# Patient Record
Sex: Male | Born: 1960 | Race: White | Hispanic: No | Marital: Married | Smoking: Never smoker
Health system: Southern US, Community
[De-identification: ages and names within clinical notes are randomized; demographics above are authoritative.]

## PROBLEM LIST (undated history)

## (undated) DIAGNOSIS — I10 Essential (primary) hypertension: Secondary | ICD-10-CM

## (undated) DIAGNOSIS — J849 Interstitial pulmonary disease, unspecified: Secondary | ICD-10-CM

## (undated) DIAGNOSIS — K219 Gastro-esophageal reflux disease without esophagitis: Secondary | ICD-10-CM

## (undated) DIAGNOSIS — E785 Hyperlipidemia, unspecified: Secondary | ICD-10-CM

## (undated) HISTORY — DX: Interstitial pulmonary disease, unspecified: J84.9

## (undated) HISTORY — DX: Gastro-esophageal reflux disease without esophagitis: K21.9

## (undated) HISTORY — DX: Essential (primary) hypertension: I10

## (undated) HISTORY — PX: WISDOM TOOTH EXTRACTION: SHX21

## (undated) HISTORY — DX: Hyperlipidemia, unspecified: E78.5

---

## 2013-06-20 DIAGNOSIS — J849 Interstitial pulmonary disease, unspecified: Secondary | ICD-10-CM

## 2013-06-20 HISTORY — DX: Interstitial pulmonary disease, unspecified: J84.9

## 2013-09-17 ENCOUNTER — Encounter: Payer: Self-pay | Admitting: Family Medicine

## 2013-09-17 ENCOUNTER — Ambulatory Visit (INDEPENDENT_AMBULATORY_CARE_PROVIDER_SITE_OTHER): Payer: BC Managed Care – PPO | Admitting: Family Medicine

## 2013-09-17 VITALS — BP 180/99 | HR 52 | Temp 98.1°F | Resp 18 | Ht 70.5 in | Wt 249.0 lb

## 2013-09-17 DIAGNOSIS — E785 Hyperlipidemia, unspecified: Secondary | ICD-10-CM

## 2013-09-17 DIAGNOSIS — Z800 Family history of malignant neoplasm of digestive organs: Secondary | ICD-10-CM

## 2013-09-17 DIAGNOSIS — I10 Essential (primary) hypertension: Secondary | ICD-10-CM | POA: Insufficient documentation

## 2013-09-17 LAB — CBC WITH DIFFERENTIAL/PLATELET
Basophils Absolute: 0 10*3/uL (ref 0.0–0.1)
Basophils Relative: 0.5 % (ref 0.0–3.0)
EOS ABS: 0.2 10*3/uL (ref 0.0–0.7)
Eosinophils Relative: 3.4 % (ref 0.0–5.0)
HCT: 45.5 % (ref 39.0–52.0)
Hemoglobin: 15.5 g/dL (ref 13.0–17.0)
LYMPHS PCT: 27.1 % (ref 12.0–46.0)
Lymphs Abs: 1.8 10*3/uL (ref 0.7–4.0)
MCHC: 34 g/dL (ref 30.0–36.0)
MCV: 84.3 fl (ref 78.0–100.0)
MONOS PCT: 7.3 % (ref 3.0–12.0)
Monocytes Absolute: 0.5 10*3/uL (ref 0.1–1.0)
Neutro Abs: 4.1 10*3/uL (ref 1.4–7.7)
Neutrophils Relative %: 61.7 % (ref 43.0–77.0)
Platelets: 188 10*3/uL (ref 150.0–400.0)
RBC: 5.4 Mil/uL (ref 4.22–5.81)
RDW: 14.2 % (ref 11.5–14.6)
WBC: 6.6 10*3/uL (ref 4.5–10.5)

## 2013-09-17 LAB — COMPREHENSIVE METABOLIC PANEL
ALT: 42 U/L (ref 0–53)
AST: 24 U/L (ref 0–37)
Albumin: 4.4 g/dL (ref 3.5–5.2)
Alkaline Phosphatase: 47 U/L (ref 39–117)
BILIRUBIN TOTAL: 1.3 mg/dL — AB (ref 0.3–1.2)
BUN: 13 mg/dL (ref 6–23)
CALCIUM: 9.1 mg/dL (ref 8.4–10.5)
CHLORIDE: 100 meq/L (ref 96–112)
CO2: 34 meq/L — AB (ref 19–32)
Creatinine, Ser: 0.9 mg/dL (ref 0.4–1.5)
GFR: 91.67 mL/min (ref >60.00)
Glucose, Bld: 112 mg/dL — ABNORMAL HIGH (ref 70–99)
Potassium: 4.4 mEq/L (ref 3.5–5.1)
SODIUM: 140 meq/L (ref 135–145)
TOTAL PROTEIN: 7.7 g/dL (ref 6.0–8.3)

## 2013-09-17 LAB — LIPID PANEL
Cholesterol: 161 mg/dL (ref 0–200)
HDL: 32.8 mg/dL — AB (ref >39.00)
LDL Cholesterol: 68 mg/dL (ref 0–99)
Total CHOL/HDL Ratio: 5
Triglycerides: 299 mg/dL — ABNORMAL HIGH (ref 0.0–149.0)
VLDL: 59.8 mg/dL — ABNORMAL HIGH (ref 0.0–40.0)

## 2013-09-17 LAB — TSH: TSH: 0.35 u[IU]/mL (ref 0.35–5.50)

## 2013-09-17 MED ORDER — LISINOPRIL-HYDROCHLOROTHIAZIDE 10-12.5 MG PO TABS: 1.0000 | ORAL_TABLET | Freq: Every day | ORAL | Status: DC

## 2013-09-17 MED ORDER — LOVASTATIN 20 MG PO TABS: 20.0000 mg | ORAL_TABLET | Freq: Every day | ORAL | Status: DC

## 2013-09-17 NOTE — Assessment & Plan Note (Signed)
Historically good control per pt's report of home monitoring. BP up today due to being off meds x 2d, new MD office setting, worried about getting back to work. Renewed lisin/hctz 10/12.5 qd and he'll continue home monitoring to document that bp returns to previously well controlled status. Once bp back to normal, I encouraged him to start 81mg  ASA qd for primary prevention of CV dz. CMET today.

## 2013-09-17 NOTE — Progress Notes (Signed)
Office Note 09/17/2013  CC:  Chief Complaint  Patient presents with  . Establish Care    HPI:  Fred [name] is a 53 y.o. White male who is here to establish care. Patient's most recent primary MD: [facility] (3-4 times).  Switched to me b/c he saw a male provider there last visit and says he prefers to talk to a male. Old records were not reviewed prior to or during today's visit.  Ran out of bp med 2 d/a.   Home monitoring of bp with wrist cuff shows 140s/80s.  Currently on lovastatin 20mg  qd and has been on this since dx of hyperlipidemia. Estimated last labs done approx 1 yr ago.  He is fasting today.  Past Medical History  Diagnosis Date  . GERD (gastroesophageal reflux disease)     controlled well with diet and prn H2 blocker  . Hyperlipidemia approx 2008  . Hypertension approx 2008    Past Surgical History  Procedure Laterality Date  . Wisdom tooth extraction  approx 2008    No bleeding or anesthesia issues.    Family History  Problem Relation Age of Onset  . Cancer Mother     colon and breast  . Arthritis Mother   . Diabetes Mother   . Heart disease Mother   . Hyperlipidemia Mother   . Kidney disease Mother   . Heart disease Father     "died in his sleep", family suspected heart dz    History   Social History  . Marital Status: Married    Spouse Name: N/A    Number of Children: N/A  . Years of Education: N/A   Occupational History  . Not on file.   Social History Main Topics  . Smoking status: Never Smoker   . Smokeless tobacco: Never Used  . Alcohol Use: Yes  . Drug Use: Not on file  . Sexual Activity: Not on file   Other Topics Concern  . Not on file   Social History Narrative   Married, no children.   [place]   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   College: [employer]   Never smoker, alcohol occasional/social,     Outpatient Encounter Prescriptions as of 09/17/2013  Medication  Sig  . lisinopril-hydrochlorothiazide (PRINZIDE,ZESTORETIC) 10-12.5 MG per tablet Take 1 tablet by mouth daily.  Marland Kitchen. lovastatin (MEVACOR) 20 MG tablet Take 1 tablet (20 mg total) by mouth at bedtime.  . [DISCONTINUED] lisinopril-hydrochlorothiazide (PRINZIDE,ZESTORETIC) 10-12.5 MG per tablet Take 1 tablet by mouth daily.  . [DISCONTINUED] lovastatin (MEVACOR) 20 MG tablet Take 20 mg by mouth at bedtime.    No Known Allergies  ROS Review of Systems  Constitutional: Negative for fever and fatigue.  HENT: Negative for congestion and sore throat.   Eyes: Negative for visual disturbance.  Respiratory: Negative for cough.   Cardiovascular: Negative for chest pain.  Gastrointestinal: Negative for nausea and abdominal pain.  Genitourinary: Negative for dysuria.  Musculoskeletal: Negative for back pain and joint swelling.  Skin: Negative for rash.  Neurological: Negative for weakness and headaches.  Hematological: Negative for adenopathy.    PE; Blood pressure 180/99, pulse 52, temperature 98.1 F (36.7 C), temperature source Oral, resp. rate 18, height 5' 10.5" (1.791 m), weight 249 lb (112.946 kg), SpO2 96.00%. Gen: Alert, well appearing.  Patient is oriented to person, place, time, and situation. ZOX:WRUEENT:Eyes: no injection, icteris, swelling, or exudate.  EOMI, PERRLA. Mouth: lips without lesion/swelling.  Oral mucosa  pink and moist. Oropharynx without erythema, exudate, or swelling.  Neck - No masses or thyromegaly or limitation in range of motion CV: RRR (HR low 60s), no m/r/g.   LUNGS: CTA bilat, nonlabored resps, good aeration in all lung fields. EXT: no clubbing, cyanosis, or edema.   Pertinent labs:  None today  ASSESSMENT AND PLAN:   New pt; obtain old records.  Hyperlipidemia Historically well controlled on lovastatin so will renew this rx and recheck FLP today. AST/ALT today.  HTN (hypertension), benign Historically good control per pt's report of home monitoring. BP up  today due to being off meds x 2d, new MD office setting, worried about getting back to work. Renewed lisin/hctz 10/12.5 qd and he'll continue home monitoring to document that bp returns to previously well controlled status. Once bp back to normal, I encouraged him to start 81mg  ASA qd for primary prevention of CV dz. CMET today.  Family history of colon cancer Made pt aware of the importance of getting screening colonoscopy. He did not want referral for this today. Will bring this up again at CPE in 6 mo, at which time we'll also do DRE/PSA screening.   An After Visit Summary was printed and given to the patient.  Return in about 6 months (around 03/19/2014) for annual CPE (fasting).

## 2013-09-17 NOTE — Assessment & Plan Note (Signed)
Historically well controlled on lovastatin so will renew this rx and recheck FLP today. AST/ALT today.

## 2013-09-17 NOTE — Assessment & Plan Note (Signed)
Made pt aware of the importance of getting screening colonoscopy. He did not want referral for this today. Will bring this up again at CPE in 6 mo, at which time we'll also do DRE/PSA screening.

## 2013-09-18 ENCOUNTER — Ambulatory Visit: Payer: BC Managed Care – PPO

## 2013-09-18 ENCOUNTER — Telehealth: Payer: Self-pay | Admitting: Family Medicine

## 2013-09-18 DIAGNOSIS — R7301 Impaired fasting glucose: Secondary | ICD-10-CM

## 2013-09-18 LAB — HEMOGLOBIN A1C: HEMOGLOBIN A1C: 5.6 % (ref 4.6–6.5)

## 2013-09-18 NOTE — Telephone Encounter (Signed)
Relevant patient education assigned to patient using Emmi. ° °

## 2013-09-19 ENCOUNTER — Other Ambulatory Visit: Payer: Self-pay | Admitting: Family Medicine

## 2013-09-19 DIAGNOSIS — E782 Mixed hyperlipidemia: Secondary | ICD-10-CM

## 2013-09-19 MED ORDER — FENOFIBRATE 160 MG PO TABS: 160.0000 mg | ORAL_TABLET | Freq: Every day | ORAL | Status: DC

## 2014-01-13 ENCOUNTER — Other Ambulatory Visit: Payer: Self-pay | Admitting: Family Medicine

## 2014-01-13 MED ORDER — FENOFIBRATE 160 MG PO TABS: 160.0000 mg | ORAL_TABLET | Freq: Every day | ORAL | Status: DC

## 2014-04-14 ENCOUNTER — Encounter: Payer: Self-pay | Admitting: Family Medicine

## 2014-04-14 ENCOUNTER — Ambulatory Visit (INDEPENDENT_AMBULATORY_CARE_PROVIDER_SITE_OTHER): Payer: BC Managed Care – PPO | Admitting: Family Medicine

## 2014-04-14 VITALS — BP 182/109 | HR 72 | Temp 97.8°F | Ht 70.5 in | Wt 245.0 lb

## 2014-04-14 DIAGNOSIS — I10 Essential (primary) hypertension: Secondary | ICD-10-CM

## 2014-04-14 DIAGNOSIS — J189 Pneumonia, unspecified organism: Secondary | ICD-10-CM

## 2014-04-14 MED ORDER — CLARITHROMYCIN 500 MG PO TABS: 500.0000 mg | ORAL_TABLET | Freq: Two times a day (BID) | ORAL | Status: DC

## 2014-04-14 MED ORDER — FENOFIBRATE 160 MG PO TABS: 160.0000 mg | ORAL_TABLET | Freq: Every day | ORAL | Status: DC

## 2014-04-14 NOTE — Progress Notes (Signed)
Pre visit review using our clinic review tool, if applicable. No additional management support is needed unless otherwise documented below in the visit note. 

## 2014-04-14 NOTE — Patient Instructions (Signed)
Don't take your lovastatin and your fenofibrate while you are taking your antibiotic (clarithromycin).  Take mucinex DM extra strength OTC as directed on packaging--for cough.

## 2014-04-14 NOTE — Progress Notes (Signed)
OFFICE NOTE  04/14/2014  CC:  Chief Complaint  Patient presents with  . Cough  . Fever   HPI: Patient is a 53 y.o. Caucasian male who is here for cough. At first he would feel post-activity cough/chest phlegm, and now in the last 1 week he has had much worse cough--constant.  Nyquil helps for short period.  Tm 100.1 as recently as several days ago.  A bit of URI sx's but primarily chest.  Question of wheezing/chest tightness.  He does not smoke.   Pertinent PMH:  Past medical, surgical, social, and family history reviewed and no changes are noted since last office visit.  MEDS:  Outpatient Prescriptions Prior to Visit  Medication Sig Dispense Refill  . fenofibrate 160 MG tablet Take 1 tablet (160 mg total) by mouth daily.  30 tablet  3  . lisinopril-hydrochlorothiazide (PRINZIDE,ZESTORETIC) 10-12.5 MG per tablet Take 1 tablet by mouth daily.  90 tablet  4  . lovastatin (MEVACOR) 20 MG tablet Take 1 tablet (20 mg total) by mouth at bedtime.  90 tablet  4   No facility-administered medications prior to visit.    PE: Blood pressure 182/109, pulse 72, temperature 97.8 F (36.6 C), temperature source Temporal, height 5' 10.5" (1.791 m), weight 245 lb (111.131 kg), SpO2 93.00%. VS: noted--normal. Gen: alert, NAD, NONTOXIC APPEARING. HEENT: eyes without injection, drainage, or swelling.  Ears: EACs clear, TMs with normal light reflex and landmarks.  Nose: Clear rhinorrhea, with some dried, crusty exudate adherent to mildly injected mucosa.  No purulent d/c.  No paranasal sinus TTP.  No facial swelling.  Throat and mouth without focal lesion.  No pharyngial swelling, erythema, or exudate.   Neck: supple, no LAD.   LUNGS: Subtle right basilar insp crackles and diminished BS, mild increase in tactile fremitus in this region compared to left base, nonlabored resps.  No wheezing, no prolongation of exp phase, no excessive post-exhalation coughing. CV: RRR, no m/r/g. EXT: no c/c/e SKIN: no  rash   IMPRESSION AND PLAN:  Clinical pneumonia. Clarithromycin 500 mg bid x 10d. Hold fenofibrate and lovastatin while taking this med. Monitor bp and HR at home daily and return to go over these and recheck current illness in 1 week.  An After Visit Summary was printed and given to the patient.  FOLLOW UP: 1 wk

## 2014-04-22 ENCOUNTER — Ambulatory Visit (INDEPENDENT_AMBULATORY_CARE_PROVIDER_SITE_OTHER): Payer: BC Managed Care – PPO | Admitting: Family Medicine

## 2014-04-22 ENCOUNTER — Encounter: Payer: Self-pay | Admitting: Family Medicine

## 2014-04-22 VITALS — BP 179/107 | HR 64 | Temp 98.6°F | Resp 18 | Ht 70.5 in | Wt 244.0 lb

## 2014-04-22 DIAGNOSIS — R0602 Shortness of breath: Secondary | ICD-10-CM

## 2014-04-22 DIAGNOSIS — J189 Pneumonia, unspecified organism: Secondary | ICD-10-CM

## 2014-04-22 MED ORDER — LISINOPRIL-HYDROCHLOROTHIAZIDE 20-25 MG PO TABS: 1.0000 | ORAL_TABLET | Freq: Every day | ORAL | Status: DC

## 2014-04-22 NOTE — Progress Notes (Signed)
Pre visit review using our clinic review tool, if applicable. No additional management support is needed unless otherwise documented below in the visit note. 

## 2014-04-22 NOTE — Progress Notes (Signed)
OFFICE NOTE  04/22/2014  CC:  Chief Complaint  Patient presents with  . Follow-up     HPI: Patient is a 53 y.o. Caucasian male who is here for 1 wk f/u pneumonia and poorly controlled HTN. Feeling better-Less cough but still SOB-esp with activity (he says this has been going on for the last year, assoc with wt gain)..  Eating fine.  No fevers.  Tolerating clarith fine.  Home BP's: 140 avg syst, avg diast 80s, HR 60s.  "My BP's always up when they check it at work".   Stressful job, might retire in 6 mo.  Pertinent PMH:  Past medical, surgical, social, and family history reviewed and no changes are noted since last office visit.  MEDS:  Outpatient Prescriptions Prior to Visit  Medication Sig Dispense Refill  . clarithromycin (BIAXIN) 500 MG tablet Take 1 tablet (500 mg total) by mouth 2 (two) times daily. 20 tablet 0  . fenofibrate 160 MG tablet Take 1 tablet (160 mg total) by mouth daily. 30 tablet 6  . lisinopril-hydrochlorothiazide (PRINZIDE,ZESTORETIC) 10-12.5 MG per tablet Take 1 tablet by mouth daily. 90 tablet 4  . lovastatin (MEVACOR) 20 MG tablet Take 1 tablet (20 mg total) by mouth at bedtime. 90 tablet 4   No facility-administered medications prior to visit.    PE: Blood pressure 179/107, pulse 64, temperature 98.6 F (37 C), temperature source Temporal, resp. rate 18, height 5' 10.5" (1.791 m), weight 244 lb (110.678 kg), SpO2 96 %. Gen: Alert, well appearing.  Patient is oriented to person, place, time, and situation. CV: RRR, no m/r/g LUNGS: Still with signif diminished BS on right--about the bottom 1/2 of post lung field, with some insp crackles mid way up but not at base.  Left side normal.  Nonlabored resps.  IMPRESSION AND PLAN:  1) Pneumonia, moderate clinical improvement.  With SOB prior to current illness + subtle worsening of his right posterior chest breath sounds I want to check a CXR today.  Finish clarith.  2) HTN, poor control.  Increase lisin-hctz  to 20/25, 1 qd. Continue home bp monitoring.  An After Visit Summary was printed and given to the patient.  FOLLOW UP: 1 mo-check BMET at that time.

## 2014-04-23 ENCOUNTER — Ambulatory Visit (HOSPITAL_BASED_OUTPATIENT_CLINIC_OR_DEPARTMENT_OTHER)
Admission: RE | Admit: 2014-04-23 | Discharge: 2014-04-23 | Disposition: A | Discharge status: Home / Self Care | Payer: BC Managed Care – PPO | Source: Ambulatory Visit | Attending: Family Medicine | Admitting: Family Medicine

## 2014-04-23 DIAGNOSIS — J189 Pneumonia, unspecified organism: Secondary | ICD-10-CM

## 2014-04-23 DIAGNOSIS — R0602 Shortness of breath: Secondary | ICD-10-CM | POA: Diagnosis not present

## 2014-04-24 ENCOUNTER — Other Ambulatory Visit: Payer: Self-pay | Admitting: Family Medicine

## 2014-04-24 ENCOUNTER — Encounter: Payer: Self-pay | Admitting: Family Medicine

## 2014-04-24 DIAGNOSIS — J849 Interstitial pulmonary disease, unspecified: Secondary | ICD-10-CM

## 2014-05-20 ENCOUNTER — Ambulatory Visit: Payer: BC Managed Care – PPO | Admitting: Family Medicine

## 2014-05-20 ENCOUNTER — Ambulatory Visit (INDEPENDENT_AMBULATORY_CARE_PROVIDER_SITE_OTHER): Payer: BC Managed Care – PPO | Admitting: Emergency Medicine

## 2014-05-20 ENCOUNTER — Encounter: Payer: Self-pay | Admitting: Emergency Medicine

## 2014-05-20 VITALS — BP 142/92 | HR 68 | Ht 70.0 in | Wt 249.4 lb

## 2014-05-20 DIAGNOSIS — J849 Interstitial pulmonary disease, unspecified: Secondary | ICD-10-CM

## 2014-05-20 DIAGNOSIS — R053 Chronic cough: Secondary | ICD-10-CM

## 2014-05-20 DIAGNOSIS — R05 Cough: Secondary | ICD-10-CM

## 2014-05-20 MED ORDER — LOSARTAN POTASSIUM-HCTZ 100-25 MG PO TABS: 1.0000 | ORAL_TABLET | Freq: Every day | ORAL | Status: DC

## 2014-05-20 NOTE — Assessment & Plan Note (Addendum)
Etiology unclear. Suspect he does have a component of GERD affecting his upper airway. Certainly he may have lower airways disease and needs pulmonary function testing. Do not know whether his chronic cough and the changes on chest x-ray are related to each other. We will change his lisinopril to losartan

## 2014-05-20 NOTE — Assessment & Plan Note (Signed)
Based on chest x-ray performed 11/4 after he was treated for an acute pneumonia versus bronchitis. Not clear to me whether the changes described are related to the infection or otherwise. Would like to perform a high-resolution CT scan of his chest now that a month has passed since he was treated.

## 2014-05-20 NOTE — Patient Instructions (Addendum)
We will perform a CT scan of your chest We will perform full pulmonary function testing We will change your lisinopril/HCTZ to losartan/HCTZ 100/25mg  once a day Follow with Dr Delton CoombesByrum next available to discuss your test results.

## 2014-05-20 NOTE — Addendum Note (Signed)
Addended by: Christen ButterASKIN, Leanord Thibeau M on: 05/20/2014 03:40 PM   Modules accepted: Orders

## 2014-05-20 NOTE — Progress Notes (Signed)
Subjective:    Patient ID: Fred [name], male    DOB: 04-13-1961, 53 y.o.   MRN: [id number]  HPI 53 yo man, never smoker, with hx HTN, GERD.  Referred by Dr Milinda CaveMcGowen, He has had a cough for a year. Then the cough became productive after he was exposed to a URI at his office, and was accompanied by fever, fatigue, malaise. He was dx with PNA and treated with clarithromycin 10/26. He improved but kept some SOB a annoying dry cough. A CXR was done 11/4, possible atypical infxn vs ILD?   No significant occupational exposures.  No Eli Lilly and Companymilitary.  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  He has cats, used to own a cockatiel but died 10 yrs ago No water damage or mold to his knowledge   He has occasional GERD sx No real allergy sx.  Lisinopril x 4 yrs ago.    Review of Systems  Constitutional: Negative for fever and unexpected weight change.  HENT: Negative for congestion, dental problem, ear pain, nosebleeds, postnasal drip, rhinorrhea, sinus pressure, sneezing, sore throat and trouble swallowing.   Eyes: Negative for redness and itching.  Respiratory: Positive for cough, shortness of breath and wheezing. Negative for chest tightness.   Cardiovascular: Negative for palpitations and leg swelling.  Gastrointestinal: Negative for nausea and vomiting.  Genitourinary: Negative for dysuria.  Musculoskeletal: Negative for joint swelling.  Skin: Negative for rash.  Neurological: Negative for headaches.  Hematological: Does not bruise/bleed easily.  Psychiatric/Behavioral: Negative for dysphoric mood. The patient is not nervous/anxious.     Past Medical History  Diagnosis Date  . GERD (gastroesophageal reflux disease)     controlled well with diet and prn H2 blocker  . Hyperlipidemia approx 2008  . Hypertension approx 2008  . Interstitial lung disease 2015    Per CXR 04/2014--referred to pulm.     Family History  Problem Relation Age of Onset  . Cancer Mother    colon and breast  . Arthritis Mother   . Diabetes Mother   . Heart disease Mother   . Hyperlipidemia Mother   . Kidney disease Mother   . Heart disease Father     "died in his sleep", family suspected heart dz     History   Social History  . Marital Status: Married    Spouse Name: N/A    Number of Children: N/A  . Years of Education: N/A   Occupational History  . Data processing managerBranch Manager    Social History Main Topics  . Smoking status: Never Smoker   . Smokeless tobacco: Never Used  . Alcohol Use: 0.0 oz/week    0 Not specified per week  . Drug Use: No  . Sexual Activity: Not on file   Other Topics Concern  . Not on file   Social History Narrative   Married, no children.   [place] resident [place].   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   College: [employer]   Never smoker, alcohol occasional/social,      No Known Allergies   Outpatient Prescriptions Prior to Visit  Medication Sig Dispense Refill  . fenofibrate 160 MG tablet Take 1 tablet (160 mg total) by mouth daily. 30 tablet 6  . lisinopril-hydrochlorothiazide (PRINZIDE,ZESTORETIC) 20-25 MG per tablet Take 1 tablet by mouth daily. 90 tablet 3  . lovastatin (MEVACOR) 20 MG tablet Take 1 tablet (20 mg total) by mouth at bedtime. 90 tablet 4  .  clarithromycin (BIAXIN) 500 MG tablet Take 1 tablet (500 mg total) by mouth 2 (two) times daily. 20 tablet 0   No facility-administered medications prior to visit.         Objective:   Physical Exam Filed Vitals:   05/20/14 1433  BP: 142/92  Pulse: 68  Height: 5\' 10"  (1.778 m)  Weight: 249 lb 6.4 oz (113.127 kg)  SpO2: 97%   Gen: Pleasant, well-nourished, in no distress,  normal affect  ENT: No lesions,  mouth clear,  oropharynx clear, no postnasal drip  Neck: No JVD, no TMG, no carotid bruits  Lungs: No use of accessory muscles, clear without rales or rhonchi  Cardiovascular: RRR, heart sounds normal, no murmur or gallops, no peripheral  edema  Musculoskeletal: No deformities, no cyanosis or clubbing  Neuro: alert, non focal  Skin: Warm, no lesions or rashes      Assessment & Plan:  ILD (interstitial lung disease) Based on chest x-ray performed 11/4 after he was treated for an acute pneumonia versus bronchitis. Not clear to me whether the changes described are related to the infection or otherwise. Would like to perform a high-resolution CT scan of his chest now that a month has passed since he was treated.   Chronic cough Etiology unclear. Suspect he does have a component of GERD affecting his upper airway. Certainly he may have lower airways disease and needs pulmonary function testing. Do not know whether his chronic cough and the changes on chest x-ray are related to each other. We will change his lisinopril to losartan

## 2014-05-28 ENCOUNTER — Ambulatory Visit (INDEPENDENT_AMBULATORY_CARE_PROVIDER_SITE_OTHER)
Admission: RE | Admit: 2014-05-28 | Discharge: 2014-05-28 | Disposition: A | Discharge status: Home / Self Care | Payer: BC Managed Care – PPO | Source: Ambulatory Visit | Attending: Emergency Medicine | Admitting: Emergency Medicine

## 2014-05-28 DIAGNOSIS — J849 Interstitial pulmonary disease, unspecified: Secondary | ICD-10-CM

## 2014-06-04 ENCOUNTER — Ambulatory Visit (INDEPENDENT_AMBULATORY_CARE_PROVIDER_SITE_OTHER): Payer: BC Managed Care – PPO | Admitting: Emergency Medicine

## 2014-06-04 DIAGNOSIS — J849 Interstitial pulmonary disease, unspecified: Secondary | ICD-10-CM

## 2014-06-04 LAB — PULMONARY FUNCTION TEST
DL/VA % PRED: 145 %
DL/VA: 6.8 ml/min/mmHg/L
DLCO UNC: 20.92 ml/min/mmHg
DLCO unc % pred: 63 %
FEF 25-75 POST: 2.86 L/s
FEF 25-75 Pre: 1.85 L/sec
FEF2575-%CHANGE-POST: 54 %
FEF2575-%PRED-PRE: 54 %
FEF2575-%Pred-Post: 85 %
FEV1-%Change-Post: 7 %
FEV1-%PRED-PRE: 42 %
FEV1-%Pred-Post: 45 %
FEV1-POST: 1.78 L
FEV1-PRE: 1.65 L
FEV1FVC-%CHANGE-POST: 8 %
FEV1FVC-%Pred-Pre: 107 %
FEV6-%CHANGE-POST: 0 %
FEV6-%PRED-POST: 40 %
FEV6-%Pred-Pre: 41 %
FEV6-PRE: 2 L
FEV6-Post: 1.98 L
FEV6FVC-%Change-Post: 0 %
FEV6FVC-%Pred-Post: 103 %
FEV6FVC-%Pred-Pre: 103 %
FVC-%CHANGE-POST: 0 %
FVC-%Pred-Post: 39 %
FVC-%Pred-Pre: 39 %
FVC-POST: 2 L
FVC-Pre: 2.01 L
POST FEV1/FVC RATIO: 89 %
PRE FEV1/FVC RATIO: 82 %
Post FEV6/FVC ratio: 100 %
Pre FEV6/FVC Ratio: 100 %
RV % pred: 46 %
RV: 0.99 L
TLC % pred: 42 %
TLC: 2.98 L

## 2014-06-04 NOTE — Progress Notes (Signed)
PFT done today. 

## 2014-06-18 ENCOUNTER — Encounter: Payer: Self-pay | Admitting: Emergency Medicine

## 2014-06-18 ENCOUNTER — Ambulatory Visit (INDEPENDENT_AMBULATORY_CARE_PROVIDER_SITE_OTHER): Payer: BC Managed Care – PPO | Admitting: Emergency Medicine

## 2014-06-18 ENCOUNTER — Other Ambulatory Visit: Payer: BC Managed Care – PPO

## 2014-06-18 VITALS — BP 190/130 | HR 73 | Ht 71.0 in | Wt 251.4 lb

## 2014-06-18 DIAGNOSIS — J849 Interstitial pulmonary disease, unspecified: Secondary | ICD-10-CM

## 2014-06-18 LAB — RHEUMATOID FACTOR

## 2014-06-18 MED ORDER — HYDROCODONE-HOMATROPINE 5-1.5 MG/5ML PO SYRP: 5.0000 mL | ORAL_SOLUTION | Freq: Four times a day (QID) | ORAL | Status: DC | PRN

## 2014-06-18 NOTE — Patient Instructions (Signed)
We will perform blood work today We will follow up and decide whether to perform bronchoscopy or to refer you for lung biopsy Please stop losartan/HCTZ and restart you old BP medication - lisinopril/HCTZ Use hycodan as directed for your cough.  Follow with Dr Delton CoombesByrum after your blood work to discuss results.

## 2014-06-18 NOTE — Progress Notes (Signed)
Subjective:    Patient ID: Fred [name], male    DOB: 10-16-1960, 53 y.o.   MRN: [id number]  HPI 53 yo man, never smoker, with hx HTN, GERD.  Referred by Dr Milinda CaveMcGowen, He has had a cough for a year. Then the cough became productive after he was exposed to a URI at his office, and was accompanied by fever, fatigue, malaise. He was dx with PNA and treated with clarithromycin 10/26. He improved but kept some SOB a annoying dry cough. A CXR was done 11/4, possible atypical infxn vs ILD?   ROV 06/18/14 -- follow up visit for cough. Returns stating that his cough is not really improved -  He underwent PFT that showed principally restriction. 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 Etiology unclear. PFT support restriction.     No significant occupational exposures.  No Eli Lilly and Companymilitary.  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  He has cats, used to own a cockatiel but died 10 yrs ago No water damage or mold to his knowledge   He has occasional GERD sx No real allergy sx.  Lisinopril x 4 yrs ago.    Review of Systems  Constitutional: Negative for fever and unexpected weight change.  HENT: Negative for congestion, dental problem, ear pain, nosebleeds, postnasal drip, rhinorrhea, sinus pressure, sneezing, sore throat and trouble swallowing.   Eyes: Negative for redness and itching.  Respiratory: Positive for cough, shortness of breath and wheezing. Negative for chest tightness.   Cardiovascular: Negative for palpitations and leg swelling.  Gastrointestinal: Negative for nausea and vomiting.  Genitourinary: Negative for dysuria.  Musculoskeletal: Negative for joint swelling.  Skin: Negative for rash.  Neurological: Negative for headaches.  Hematological: Does not bruise/bleed easily.  Psychiatric/Behavioral: Negative for dysphoric mood. The patient is not nervous/anxious.     Past Medical History  Diagnosis Date  . GERD (gastroesophageal reflux  disease)     controlled well with diet and prn H2 blocker  . Hyperlipidemia approx 2008  . Hypertension approx 2008  . Interstitial lung disease 2015    Per CXR 04/2014--referred to pulm.     Family History  Problem Relation Age of Onset  . Cancer Mother     colon and breast  . Arthritis Mother   . Diabetes Mother   . Heart disease Mother   . Hyperlipidemia Mother   . Kidney disease Mother   . Heart disease Father     "died in his sleep", family suspected heart dz     History   Social History  . Marital Status: Married    Spouse Name: N/A    Number of Children: N/A  . Years of Education: N/A   Occupational History  . Data processing managerBranch Manager    Social History Main Topics  . Smoking status: Never Smoker   . Smokeless tobacco: Never Used  . Alcohol Use: 0.0 oz/week    0 Not specified per week  . Drug Use: No  . Sexual Activity: Not on file   Other Topics Concern  . Not on file   Social History Narrative   Married, no children.   Raised [place] resident since [place].   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   College: [employer]   Never smoker, alcohol occasional/social,      No Known Allergies   Outpatient Prescriptions Prior to Visit  Medication Sig Dispense Refill  . fenofibrate 160 MG tablet Take  1 tablet (160 mg total) by mouth daily. 30 tablet 6  . losartan-hydrochlorothiazide (HYZAAR) 100-25 MG per tablet Take 1 tablet by mouth daily. 30 tablet 2  . lovastatin (MEVACOR) 20 MG tablet Take 1 tablet (20 mg total) by mouth at bedtime. 90 tablet 4  . lisinopril-hydrochlorothiazide (PRINZIDE,ZESTORETIC) 20-25 MG per tablet Take 1 tablet by mouth daily. (Patient not taking: Reported on 06/18/2014) 90 tablet 3   No facility-administered medications prior to visit.         Objective:   Physical Exam Filed Vitals:   06/18/14 1341  BP: 190/130  Pulse: 73  Height: 5\' 11"  (1.803 m)  Weight: 251 lb 6.4 oz (114.034 kg)  SpO2: 95%   Gen: Pleasant,  well-nourished, in no distress,  normal affect  ENT: No lesions,  mouth clear,  oropharynx clear, no postnasal drip  Neck: No JVD, no TMG, no carotid bruits  Lungs: No use of accessory muscles, B insp crackles at bases.   Cardiovascular: RRR, heart sounds normal, no murmur or gallops, no peripheral edema  Musculoskeletal: No deformities, no cyanosis or clubbing  Neuro: alert, non focal  Skin: Warm, no lesions or rashes      Assessment & Plan:  ILD (interstitial lung disease) Confirmed on his CT chest. Etiology unclear, looks like a UIP pattern. Suspect that this is the cause of his cough and sx.  - auto-immune labs - will need to consider early VATS, will discuss with him next time.  - may want to refer to Dr MR to discuss trials, dx, perfinidone, etc.  - rov next available.

## 2014-06-18 NOTE — Assessment & Plan Note (Signed)
Confirmed on his CT chest. Etiology unclear, looks like a UIP pattern. Suspect that this is the cause of his cough and sx.  - auto-immune labs - will need to consider early VATS, will discuss with him next time.  - may want to refer to Dr MR to discuss trials, dx, perfinidone, etc.  - rov next available.

## 2014-06-19 LAB — ANCA SCREEN W REFLEX TITER
Atypical p-ANCA Screen: NEGATIVE
P-ANCA SCREEN: NEGATIVE
c-ANCA Screen: NEGATIVE

## 2014-06-19 LAB — SJOGRENS SYNDROME-A EXTRACTABLE NUCLEAR ANTIBODY: SSA (Ro) (ENA) Antibody, IgG: <1

## 2014-06-19 LAB — ANTI-SCLERODERMA ANTIBODY: Scleroderma (Scl-70) (ENA) Antibody, IgG: <1

## 2014-06-19 LAB — SJOGRENS SYNDROME-B EXTRACTABLE NUCLEAR ANTIBODY: SSB (La) (ENA) Antibody, IgG: <1

## 2014-06-19 LAB — ANTI-DNA ANTIBODY, DOUBLE-STRANDED

## 2014-06-19 LAB — ANA: ANA: NEGATIVE

## 2014-07-03 ENCOUNTER — Ambulatory Visit (INDEPENDENT_AMBULATORY_CARE_PROVIDER_SITE_OTHER): Payer: BLUE CROSS/BLUE SHIELD | Admitting: Emergency Medicine

## 2014-07-03 ENCOUNTER — Encounter: Payer: Self-pay | Admitting: Emergency Medicine

## 2014-07-03 VITALS — BP 164/92 | HR 81 | Temp 97.9°F | Ht 71.0 in | Wt 249.0 lb

## 2014-07-03 DIAGNOSIS — J849 Interstitial pulmonary disease, unspecified: Secondary | ICD-10-CM

## 2014-07-03 NOTE — Patient Instructions (Signed)
We will arrange for bronchoscopy on January 22 We will also arrange for you to follow-up with Dr. Marchelle Gearingamaswamy to further discuss your interstitial lung disease.

## 2014-07-03 NOTE — Assessment & Plan Note (Signed)
Interstitial lung disease of unclear etiology. 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 His autoimmune labs are negative so far. He does still need to have a CCP performed. We discussed the potential next steps. He would like to defer VATS biopsy if possible. We will perform bronchoscopy with BAL and transbronchial biopsies. If these are unrevealing then I will refer him to see Dr Marchelle Gearingamaswamy to evaluate presumed IPF.

## 2014-07-03 NOTE — Progress Notes (Signed)
Subjective:    Patient ID: Fred [name], male    DOB: 14-Sep-1960, 54 y.o.   MRN: [id number]  HPI 54 yo man, never smoker, with hx HTN, GERD.  Referred by Dr Milinda Cave, He has had a cough for a year. Then the cough became productive after he was exposed to a URI at his office, and was accompanied by fever, fatigue, malaise. He was dx with PNA and treated with clarithromycin 10/26. He improved but kept some SOB a annoying dry cough. A CXR was done 11/4, possible atypical infxn vs ILD?   ROV 06/18/14 -- follow up visit for cough. Returns stating that his cough is not really improved -  He underwent PFT that showed principally restriction. 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 Etiology unclear. PFT support restriction.   ROV 07/03/14 -- follow up visit for UIP pattern on CT scan. He returns to discuss auto-immune labs > all negative (ANA, RF, ANCA, anti-DNA, Sjogren's, anti-scl70).  He is feeling the same-he has the same cough and the same exertional dyspnea. He is most disturbed by the fact that he can't do the same tasks that he used to be able to perform without being short of breath. He has not had an ACE or CCP level yet.     No significant occupational exposures.  No [employer].  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  He has cats, used to own a cockatiel but died 10 yrs ago No water damage or mold to his knowledge   He has occasional GERD sx No real allergy sx.  Lisinopril x 4 yrs ago.    Review of Systems  Constitutional: Negative for fever and unexpected weight change.  HENT: Negative for congestion, dental problem, ear pain, nosebleeds, postnasal drip, rhinorrhea, sinus pressure, sneezing, sore throat and trouble swallowing.   Eyes: Negative for redness and itching.  Respiratory: Positive for cough, shortness of breath and wheezing. Negative for chest tightness.   Cardiovascular: Negative for palpitations and leg swelling.    Gastrointestinal: Negative for nausea and vomiting.  Genitourinary: Negative for dysuria.  Musculoskeletal: Negative for joint swelling.  Skin: Negative for rash.  Neurological: Negative for headaches.  Hematological: Does not bruise/bleed easily.  Psychiatric/Behavioral: Negative for dysphoric mood. The patient is not nervous/anxious.        Objective:   Physical Exam Filed Vitals:   07/03/14 1342  BP: 164/92  Pulse: 81  Temp: 97.9 F (36.6 C)  TempSrc: Oral  Height:  (1.803 m)  Weight: 249 lb (112.946 kg)  SpO2: 92%   Gen: Pleasant, well-nourished, in no distress,  normal affect  ENT: No lesions,  mouth clear,  oropharynx clear, no postnasal drip  Neck: No JVD, no TMG, no carotid bruits  Lungs: No use of accessory muscles, B insp crackles at bases.   Cardiovascular: RRR, heart sounds normal, no murmur or gallops, no peripheral edema  Musculoskeletal: No deformities, no cyanosis or clubbing  Neuro: alert, non focal  Skin: Warm, no lesions or rashes      Assessment & Plan:  ILD (interstitial lung disease) Interstitial lung disease of unclear etiology. 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 His autoimmune labs are negative so far. He does still need to have a CCP performed. We discussed the potential next steps. He would like to defer VATS biopsy if possible. We will perform bronchoscopy with BAL and transbronchial  biopsies. If these are unrevealing then I will refer him to see Dr Marchelle Gearingamaswamy to evaluate presumed IPF.

## 2014-07-11 ENCOUNTER — Ambulatory Visit (HOSPITAL_COMMUNITY)
Admission: RE | Admit: 2014-07-11 | Payer: BLUE CROSS/BLUE SHIELD | Source: Ambulatory Visit | Admitting: Emergency Medicine

## 2014-07-11 ENCOUNTER — Encounter (HOSPITAL_COMMUNITY): Payer: BLUE CROSS/BLUE SHIELD

## 2014-07-11 ENCOUNTER — Encounter (HOSPITAL_COMMUNITY): Admission: RE | Payer: Self-pay | Source: Ambulatory Visit

## 2014-07-11 SURGERY — BRONCHOSCOPY, WITH FLUOROSCOPY
Anesthesia: Moderate Sedation | Laterality: Bilateral

## 2014-07-24 ENCOUNTER — Ambulatory Visit: Payer: BLUE CROSS/BLUE SHIELD | Admitting: Internal Medicine

## 2014-08-21 ENCOUNTER — Ambulatory Visit: Payer: BLUE CROSS/BLUE SHIELD | Admitting: Emergency Medicine

## 2014-10-06 ENCOUNTER — Telehealth: Payer: Self-pay | Admitting: Emergency Medicine

## 2014-10-06 NOTE — Telephone Encounter (Signed)
Spoke with pt. Advised him that RB is on vacation today and that I would need to check with him about when to schedule this. He verbalized understanding.

## 2014-10-07 NOTE — Telephone Encounter (Signed)
Per RB >> [facility]  [facility] Pt is aware of procedure date, time and location. RB has been made aware as well. Nothing further was needed.

## 2014-10-10 ENCOUNTER — Ambulatory Visit (HOSPITAL_COMMUNITY)
Admission: RE | Admit: 2014-10-10 | Discharge: 2014-10-10 | Disposition: A | Discharge status: Home / Self Care | Payer: BLUE CROSS/BLUE SHIELD | Source: Ambulatory Visit | Attending: Emergency Medicine | Admitting: Emergency Medicine

## 2014-10-10 ENCOUNTER — Ambulatory Visit (HOSPITAL_COMMUNITY): Payer: BLUE CROSS/BLUE SHIELD

## 2014-10-10 ENCOUNTER — Encounter (HOSPITAL_COMMUNITY)
Admission: RE | Disposition: A | Discharge status: Home / Self Care | Payer: BLUE CROSS/BLUE SHIELD | Source: Ambulatory Visit | Attending: Emergency Medicine

## 2014-10-10 DIAGNOSIS — J849 Interstitial pulmonary disease, unspecified: Secondary | ICD-10-CM

## 2014-10-10 DIAGNOSIS — K219 Gastro-esophageal reflux disease without esophagitis: Secondary | ICD-10-CM | POA: Diagnosis not present

## 2014-10-10 DIAGNOSIS — I10 Essential (primary) hypertension: Secondary | ICD-10-CM | POA: Diagnosis not present

## 2014-10-10 DIAGNOSIS — Z9889 Other specified postprocedural states: Secondary | ICD-10-CM | POA: Insufficient documentation

## 2014-10-10 DIAGNOSIS — R05 Cough: Secondary | ICD-10-CM | POA: Diagnosis present

## 2014-10-10 HISTORY — PX: VIDEO BRONCHOSCOPY: SHX5072

## 2014-10-10 HISTORY — PX: FLEXIBLE BRONCHOSCOPY W/ BRONCHOPULMONARY LAVAGE: SHX1645

## 2014-10-10 SURGERY — BRONCHOSCOPY, WITH FLUOROSCOPY
Anesthesia: Moderate Sedation | Laterality: Bilateral

## 2014-10-10 MED ORDER — SODIUM CHLORIDE 0.9 % IV SOLN
INTRAVENOUS | Status: DC
  Administered 2014-10-10: 08:00:00 via INTRAVENOUS

## 2014-10-10 MED ORDER — MIDAZOLAM HCL 10 MG/2ML IJ SOLN
INTRAMUSCULAR | Status: DC | PRN
Start: 2014-10-10 — End: 2014-10-10
  Administered 2014-10-10 (×3): 2 mg via INTRAVENOUS
  Administered 2014-10-10: 3 mg via INTRAVENOUS

## 2014-10-10 MED ORDER — HYDRALAZINE HCL 20 MG/ML IJ SOLN
10.0000 mg | Freq: Once | INTRAMUSCULAR | Status: AC
  Administered 2014-10-10: 10 mg via INTRAVENOUS

## 2014-10-10 MED ORDER — HYDRALAZINE HCL 20 MG/ML IJ SOLN
INTRAMUSCULAR | Status: AC
  Filled 2014-10-10: qty 1

## 2014-10-10 MED ORDER — MIDAZOLAM HCL 10 MG/2ML IJ SOLN
INTRAMUSCULAR | Status: AC
  Filled 2014-10-10: qty 4

## 2014-10-10 MED ORDER — FENTANYL CITRATE (PF) 100 MCG/2ML IJ SOLN
INTRAMUSCULAR | Status: DC | PRN
  Administered 2014-10-10: 50 ug via INTRAVENOUS
  Administered 2014-10-10: 25 ug via INTRAVENOUS
  Administered 2014-10-10: 50 ug via INTRAVENOUS
  Administered 2014-10-10: 25 ug via INTRAVENOUS

## 2014-10-10 MED ORDER — LIDOCAINE HCL 1 % IJ SOLN
INTRAMUSCULAR | Status: DC | PRN
  Administered 2014-10-10: 6 mL via RESPIRATORY_TRACT

## 2014-10-10 MED ORDER — PHENYLEPHRINE HCL 0.25 % NA SOLN: 1.0000 | Freq: Four times a day (QID) | NASAL | Status: DC | PRN

## 2014-10-10 MED ORDER — PHENYLEPHRINE HCL 0.25 % NA SOLN
NASAL | Status: DC | PRN
  Administered 2014-10-10: 2 via NASAL

## 2014-10-10 MED ORDER — LIDOCAINE HCL 2 % EX GEL: 1.0000 "application " | Freq: Once | CUTANEOUS | Status: DC

## 2014-10-10 MED ORDER — FENTANYL CITRATE (PF) 100 MCG/2ML IJ SOLN
INTRAMUSCULAR | Status: AC
  Filled 2014-10-10: qty 4

## 2014-10-10 MED ORDER — LIDOCAINE HCL 2 % EX GEL
CUTANEOUS | Status: DC | PRN
  Administered 2014-10-10: 1

## 2014-10-10 MED ORDER — LISINOPRIL 20 MG PO TABS: 20.0000 mg | ORAL_TABLET | Freq: Every day | ORAL | Status: DC

## 2014-10-10 NOTE — Discharge Instructions (Signed)
Flexible Bronchoscopy, Care After These instructions give you information on caring for yourself after your procedure. Your doctor may also give you more specific instructions. Call your doctor if you have any problems or questions after your procedure. HOME CARE  Do not eat or drink anything for 2 hours after your procedure. If you try to eat or drink before the medicine wears off, food or drink could go into your lungs. You could also burn yourself.  After 2 hours have passed and when you can cough and gag normally, you may eat soft food and drink liquids slowly.  The day after the test, you may eat your normal diet.  You may do your normal activities.  Keep all doctor visits. GET HELP RIGHT AWAY IF:  You get more and more short of breath.  You get light-headed.  You feel like you are going to pass out (faint).  You have chest pain.  You have new problems that worry you.  You cough up more than a little blood.  You cough up more blood than before. MAKE SURE YOU:  Understand these instructions.  Will watch your condition.  Will get help right away if you are not doing well or get worse. Document Released: 04/03/2009 Document Revised: 06/11/2013 Document Reviewed: 02/08/2013 [facility] Patient Information 2015 [employer] [place]. This information is not intended to replace advice given to you by your health care provider. Make sure you discuss any questions you have with your health care provider.   Do not eat or drink until after 1115 today 10/10/14  Please call our office for any questions or problems, [phone]

## 2014-10-10 NOTE — H&P (Addendum)
  Patient ID: Fred [name], male DOB: 1960-07-24, 54 y.o. MRN: [id number]  HPI 54 yo man, never smoker, with hx HTN, GERD. Referred by Dr Milinda CaveMcGowen, He has had a cough for a year. Then the cough became productive after he was exposed to a URI at his office, and was accompanied by fever, fatigue, malaise. He was dx with PNA and treated with clarithromycin 10/26. He improved but kept some SOB a annoying dry cough. A CXR was done 11/4, possible atypical infxn vs ILD?   ROV 06/18/14 -- follow up visit for cough. Returns stating that his cough is not really improved -  He underwent PFT that showed principally restriction. 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 Etiology unclear. PFT support restriction.   ROV 07/03/14 -- follow up visit for UIP pattern on CT scan. He returns to discuss auto-immune labs > all negative (ANA, RF, ANCA, anti-DNA, Sjogren's, anti-scl70). He is feeling the same-he has the same cough and the same exertional dyspnea. He is most disturbed by the fact that he can't do the same tasks that he used to be able to perform without being short of breath. He has not had an ACE or CCP level yet.   ROV 10/10/14 - presents today for FOB to further w/u UIP, dyspnea, cough. No clear etiology to date. Auto-immune labs negative. Of note he has had more SOB, more cough since our last visit in January. He is hypertensive to 200's systolic on presentation, has been taking lisinopril (chamnged back from [facility]).     No significant occupational exposures.  No [employer].  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  He has cats, used to own a cockatiel but died 10 yrs ago No water damage or mold to his knowledge   He has occasional GERD sx No real allergy sx.  Lisinopril x 4 yrs ago.       Objective:   Physical Exam Filed Vitals:   10/10/14 0805 10/10/14 0810 10/10/14 0815 10/10/14 0820  BP: 203/109 200/107 209/117 209/107  Temp:      TempSrc:       Resp: 16 14 14 12   SpO2: 100% 100% 99% 100%                              Gen: Pleasant, obese, in no distress, normal affect  ENT: No lesions, mouth clear, oropharynx clear, no postnasal drip  Neck: No JVD, no TMG, no carotid bruits  Lungs: No use of accessory muscles, B insp crackles at bases.   Cardiovascular: RRR, heart sounds normal, no murmur or gallops, no peripheral edema  Musculoskeletal: No deformities, no cyanosis or clubbing  Neuro: alert, non focal  Skin: Warm, no lesions or rashes      Assessment & Plan:  ILD (interstitial lung disease) Planning for FOB, BAL, TBBx's today. No barriers identified with exception of his HTN which will be treated, all questions answered.   Levy Pupaobert Nicko Daher, MD, PhD 10/10/2014, 8:34 AM [facility] [phone] or if no answer [phone]

## 2014-10-10 NOTE — Progress Notes (Signed)
Video bronchoscopy performed Intervention bronchial washings Intervention bronchial biopsy Pt tolerated well  Jacqulynn CadetHopper, Justin David RRT   Levy Pupaobert Cyd Hostler, MD, PhD 10/15/2014, 7:27 PM [facility] [phone] or if no answer [phone]

## 2014-10-10 NOTE — Op Note (Signed)
Video Bronchoscopy Procedure Note  Date of Operation: 10/10/2014  Pre-op Diagnosis: Interstitial lung disease, cough  Post-op Diagnosis: Same  Surgeon: Levy PupaOBERT Valeriano Bain  Assistants: none  Anesthesia: conscious sedation, moderate sedation  Meds Given: fentanyl 150mcg, versed 9mg  in divided doses, 1% lidocaine 20cc total, hydralazine 20mg  (given in divided doses)  Operation: Flexible video fiberoptic bronchoscopy and biopsies.  Estimated Blood Loss: 5cc  Complications: none noted  Indications and History: Fred [name] is 54 y.o. with history of cough, dyspnea and ILD on CT chest.  Recommendation was to perform video fiberoptic bronchoscopy with biopsies and BAL. The risks, benefits, complications, treatment options and expected outcomes were discussed with the patient.  The possibilities of pneumothorax, pneumonia, reaction to medication, pulmonary aspiration, perforation of a viscus, bleeding, failure to diagnose a condition and creating a complication requiring transfusion or operation were discussed with the patient who freely signed the consent.    Description of Procedure: The patient was seen in the Preoperative Area, was examined and was deemed appropriate to proceed.  [facility], identified as Fred [name] and the procedure verified as Flexible Video Fiberoptic Bronchoscopy.  A Time Out was held and the above information confirmed.   Conscious sedation was initiated as indicated above. Even after adequate sedation pt had a BP of 190/100's. Hydralazine was given to achieve BP 145/89 before starting. BP further improved to 130/60's during the case. The video fiberoptic bronchoscope was introduced via the R nare and a general inspection was performed which showed normal cords, normal trachea, normal main carina. The R sided airways were inspected and showed normal RUL, BI, RML and RLL. The L side was then inspected. The LLL, Lingular and LUL airways were  normal.   A RUL BAL was performed to be sent for cytology and culture.  Then under fluoroscopic guidance RML transbronchial biopsies were performed for pathology.  The patient tolerated the procedure well. The bronchoscope was removed. There were no obvious complications. A post-procedural CXR showed no evidence PTX.   Samples: 1. Bronhcoalveolar lavage from RUL 2. Transbronchial biopsies from the RML  Plans:  We will review the cytology, pathology and microbiology results with the patient when they become available. I believe if his cx's are negative and he continues to be symptomatic, we should refer him to see Dr Marchelle Gearingamaswamy. Also, an empiric trial of steroids would be reasonable at that time. I will contact him next week and consider a course of prednisone.  Outpatient followup will be with Dr Delton CoombesByrum.    Levy Pupaobert Latiana Tomei, MD, PhD 10/10/2014, 9:21 AM [name] [phone] or if no answer [phone]

## 2014-10-13 ENCOUNTER — Encounter: Payer: Self-pay | Admitting: Family Medicine

## 2014-10-13 LAB — CULTURE, BAL-QUANTITATIVE: COLONY COUNT: NO GROWTH

## 2014-10-13 LAB — CULTURE, BAL-QUANTITATIVE W GRAM STAIN
Culture: NO GROWTH
Special Requests: NORMAL

## 2014-10-14 ENCOUNTER — Encounter (HOSPITAL_COMMUNITY): Payer: Self-pay | Admitting: Emergency Medicine

## 2014-10-17 ENCOUNTER — Telehealth: Payer: Self-pay | Admitting: Emergency Medicine

## 2014-10-17 NOTE — Telephone Encounter (Signed)
Called and spoke to pt's wife. Pt's wife is requesting the results of bronch. Pt's wife is aware that once results come back we will call.   RB please advise on results.

## 2014-10-17 NOTE — Telephone Encounter (Signed)
Please let the patient know that his biopsies are all negative / normal. His cultures are negative so far, will  have to follow these for several more weeks until final

## 2014-10-17 NOTE — Telephone Encounter (Signed)
Patients wife notified of results.  She wants to know what is wrong with her husband since everything is coming back normal?    RB - any ideas? Please advise.

## 2014-10-20 NOTE — Telephone Encounter (Signed)
The culture data will not be back for a few weeks. Nothing new to tell them until then

## 2014-10-20 NOTE — Telephone Encounter (Signed)
Spoke with pt's wife. She is aware that we are still waiting on the culture results. Pt is now coughing a lot, so much that he is throwing up. They would like to know if he could have some prednisone or an antibiotic.  RB - please advise. Thanks.

## 2014-10-20 NOTE — Telephone Encounter (Signed)
Do you want to be overbooked? Next available is near end of month? thanks

## 2014-10-20 NOTE — Telephone Encounter (Signed)
Need to see in office to discuss

## 2014-10-21 NOTE — Telephone Encounter (Signed)
Rb please advise.  Thanks! 

## 2014-10-22 MED ORDER — PREDNISONE 10 MG PO TABS: ORAL_TABLET | ORAL | Status: DC

## 2014-10-22 NOTE — Telephone Encounter (Signed)
Called spouse. She reports they are willing to take the risk and wants pred called in. I have done so and will forward to RB as an [place]

## 2014-10-22 NOTE — Telephone Encounter (Signed)
Giving him prednisone without knowing his cx data carries risk for infection. I would be willing to try prednisone although it is not clear to me that we know what we are treating. If they are ok trying this in light of the risks, then order pred Take 40mg  daily for 3 days, then 30mg  daily for 3 days, then 20mg  daily for 3 days, then 10mg  daily for 3 days, then stop

## 2014-11-07 ENCOUNTER — Ambulatory Visit (INDEPENDENT_AMBULATORY_CARE_PROVIDER_SITE_OTHER): Payer: BLUE CROSS/BLUE SHIELD | Admitting: Emergency Medicine

## 2014-11-07 ENCOUNTER — Encounter: Payer: Self-pay | Admitting: Emergency Medicine

## 2014-11-07 VITALS — BP 140/80 | HR 71 | Resp 22 | Ht 68.0 in | Wt 249.0 lb

## 2014-11-07 DIAGNOSIS — J849 Interstitial pulmonary disease, unspecified: Secondary | ICD-10-CM

## 2014-11-07 LAB — FUNGUS CULTURE W SMEAR
Fungal Smear: NONE SEEN
Special Requests: NORMAL

## 2014-11-07 MED ORDER — PREDNISONE 20 MG PO TABS: 40.0000 mg | ORAL_TABLET | Freq: Every day | ORAL | Status: DC

## 2014-11-07 NOTE — Patient Instructions (Addendum)
We will try using prednisone 40mg  daily for the next month. Please call us if you develop any side effects.  Depending on your response to the prednisone we will make some decisions about whether we should repeat your CT scan of the chest or pursue a surgical biopsy.   Follow with Dr Delton CoombesByrum in 1 month.

## 2014-11-07 NOTE — Progress Notes (Signed)
Subjective:    Patient ID: Fred [name], male    DOB: [birth year], 54 y.o.   MRN: [id number]  HPI 54 yo man, never smoker, with hx HTN, GERD.  Referred by Dr Milinda CaveMcGowen, He has had a cough for a year. Then the cough became productive after he was exposed to a URI at his office, and was accompanied by fever, fatigue, malaise. He was dx with PNA and treated with clarithromycin 10/26. He improved but kept some SOB a annoying dry cough. A CXR was done 11/4, possible atypical infxn vs ILD?   ROV 06/18/14 -- follow up visit for cough. Returns stating that his cough is not really improved -  He underwent PFT that showed principally restriction. 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 Etiology unclear. PFT support restriction.   ROV 07/03/14 -- follow up visit for UIP pattern on CT scan. He returns to discuss auto-immune labs > all negative (ANA, RF, ANCA, anti-DNA, Sjogren's, anti-scl70).  He is feeling the same-he has the same cough and the same exertional dyspnea. He is most disturbed by the fact that he can't do the same tasks that he used to be able to perform without being short of breath. He has not had an ACE or CCP level yet.   ROV 11/07/14 -- follow-up visit for interstitial lung disease and a UIP pattern on CT scan of the chest. He is undergone bronchoscopy with negative transbronchial biopsies and negative cultures thus far. Started him on empiric prednisone taper. He is unsure whether he improved on the pred but it was not a steady dose. He has labored breathing and cough with exertion.    No significant occupational exposures.  No [employer] and [employer].  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  He has cats, used to own a cockatiel but died 10 yrs ago No water damage or mold to his knowledge   He has occasional GERD sx No real allergy sx.  Lisinopril x 4 yrs ago.    Review of Systems  Constitutional: Negative for fever and unexpected weight change.  HENT:  Negative for congestion, dental problem, ear pain, nosebleeds, postnasal drip, rhinorrhea, sinus pressure, sneezing, sore throat and trouble swallowing.   Eyes: Negative for redness and itching.  Respiratory: Positive for cough, shortness of breath and wheezing. Negative for chest tightness.   Cardiovascular: Negative for palpitations and leg swelling.  Gastrointestinal: Negative for nausea and vomiting.  Genitourinary: Negative for dysuria.  Musculoskeletal: Negative for joint swelling.  Skin: Negative for rash.  Neurological: Negative for headaches.  Hematological: Does not bruise/bleed easily.  Psychiatric/Behavioral: Negative for dysphoric mood. The patient is not nervous/anxious.        Objective:   Physical Exam Filed Vitals:   10/10/14 1030 10/10/14 1040 10/10/14 1050 11/07/14 1545  BP: 94/47 117/50 116/61 140/80  Pulse: 64 65 63 71  Resp: 25 25 22    Height:    5\' 8"  (1.727 m)  Weight:    249 lb (112.946 kg)  SpO2: 91% 92% 93% 94%   Gen: Pleasant, well-nourished, in no distress,  normal affect  ENT: No lesions,  mouth clear,  oropharynx clear, no postnasal drip  Neck: No JVD, no TMG, no carotid bruits  Lungs: No use of accessory muscles, B insp crackles at bases.   Cardiovascular: RRR, heart sounds normal, no murmur or gallops, no peripheral edema  Musculoskeletal: No deformities, no cyanosis or clubbing  Neuro: alert, non  focal  Skin: Warm, no lesions or rashes      Assessment & Plan:  ILD (interstitial lung disease) Etiology remains unclear. 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 I would like to see if he would respond to steroids and he agrees. I'll start him on 40 mg daily for one month and assess his status both clinically and probably also with a CT chest. We discussed surgical biopsy and he may need one in the future. I'll probably not do this right after a course of steroids. If we do believe he has IPF then we will work  towards targeted medications.     We will try using prednisone 40mg  daily for the next month. Please call us if you develop any side effects.  Depending on your response to the prednisone we will make some decisions about whether we should repeat your CT scan of the chest or pursue a surgical biopsy.   Follow with Dr Delton CoombesByrum in 1 month.

## 2014-11-07 NOTE — Assessment & Plan Note (Signed)
Etiology remains unclear. 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 I would like to see if he would respond to steroids and he agrees. I'll start him on 40 mg daily for one month and assess his status both clinically and probably also with a CT chest. We discussed surgical biopsy and he may need one in the future. I'll probably not do this right after a course of steroids. If we do believe he has IPF then we will work towards targeted medications.     We will try using prednisone 40mg  daily for the next month. Please call us if you develop any side effects.  Depending on your response to the prednisone we will make some decisions about whether we should repeat your CT scan of the chest or pursue a surgical biopsy.   Follow with Dr Delton CoombesByrum in 1 month.

## 2014-11-22 LAB — AFB CULTURE WITH SMEAR (NOT AT ARMC)
Acid Fast Smear: NONE SEEN
Special Requests: NORMAL

## 2014-12-09 ENCOUNTER — Ambulatory Visit: Payer: BLUE CROSS/BLUE SHIELD | Admitting: Emergency Medicine

## 2014-12-09 ENCOUNTER — Encounter: Payer: Self-pay | Admitting: Emergency Medicine

## 2014-12-09 VITALS — BP 122/70 | HR 73 | Ht 71.0 in | Wt 243.0 lb

## 2014-12-09 DIAGNOSIS — J849 Interstitial pulmonary disease, unspecified: Secondary | ICD-10-CM

## 2014-12-09 NOTE — Patient Instructions (Signed)
We will perform a high resolution CT scan of your chest to compare with your previous one Follow with Dr Delton Coombes next available to discuss your scan and our next steps.

## 2014-12-09 NOTE — Progress Notes (Signed)
Subjective:    Patient ID: Fred [name], male    DOB: 19-Jun-1961, 54 y.o.   MRN: [id number]  HPI 54 yo man, never smoker, with hx HTN, GERD.  Referred by Dr Milinda Cave, He has had a cough for a year. Then the cough became productive after he was exposed to a URI at his office, and was accompanied by fever, fatigue, malaise. He was dx with PNA and treated with clarithromycin 10/26. He improved but kept some SOB a annoying dry cough. A CXR was done 11/4, possible atypical infxn vs ILD?   ROV 06/18/14 -- follow up visit for cough. Returns stating that his cough is not really improved -  He underwent PFT that showed principally restriction. 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 Etiology unclear. PFT support restriction.   ROV 07/03/14 -- follow up visit for UIP pattern on CT scan. He returns to discuss auto-immune labs > all negative (ANA, RF, ANCA, anti-DNA, Sjogren's, anti-scl70).  He is feeling the same-he has the same cough and the same exertional dyspnea. He is most disturbed by the fact that he can't do the same tasks that he used to be able to perform without being short of breath. He has not had an ACE or CCP level yet.   ROV 11/07/14 -- follow-up visit for interstitial lung disease and a UIP pattern on CT scan of the chest. He is undergone bronchoscopy with negative transbronchial biopsies and negative cultures thus far. Started him on empiric prednisone taper. He is unsure whether he improved on the pred but it was not a steady dose. He has labored breathing and cough with exertion.   ROV 12/09/14 -- follow-up visit for interstitial lung disease / UIP. We started empiric prednisone 40 mg daily at his last visit one month ago. He had some sleep disturbance, was more active. There were days when he felt his breathing was a bit better, but he still had coughing spells that would limit him. He coughs up some whitish phlegm.  f  No significant occupational exposures.  No  [employer].  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  He has cats, used to own a cockatiel but died 10 yrs ago No water damage or mold to his knowledge   He has occasional GERD sx No real allergy sx.  Lisinopril x 4 yrs ago.    Review of Systems  Constitutional: Negative for fever and unexpected weight change.  HENT: Negative for congestion, dental problem, ear pain, nosebleeds, postnasal drip, rhinorrhea, sinus pressure, sneezing, sore throat and trouble swallowing.   Eyes: Negative for redness and itching.  Respiratory: Positive for cough, shortness of breath and wheezing. Negative for chest tightness.   Cardiovascular: Negative for palpitations and leg swelling.  Gastrointestinal: Negative for nausea and vomiting.  Genitourinary: Negative for dysuria.  Musculoskeletal: Negative for joint swelling.  Skin: Negative for rash.  Neurological: Negative for headaches.  Hematological: Does not bruise/bleed easily.  Psychiatric/Behavioral: Negative for dysphoric mood. The patient is not nervous/anxious.        Objective:   Physical Exam Filed Vitals:   12/09/14 1521  BP: 122/70  Pulse: 73  Height:  (1.803 m)  Weight: 243 lb (110.224 kg)  SpO2: 93%   Gen: Pleasant, well-nourished, in no distress,  normal affect  ENT: No lesions,  mouth clear,  oropharynx clear, no postnasal drip  Neck: No JVD, no TMG, no carotid bruits  Lungs:  No use of accessory muscles, B insp crackles at bases.   Cardiovascular: RRR, heart sounds normal, no murmur or gallops, no peripheral edema  Musculoskeletal: No deformities, no cyanosis or clubbing  Neuro: alert, non focal  Skin: Warm, no lesions or rashes      Assessment & Plan:  No problem-specific assessment & plan notes found for this encounter.

## 2014-12-16 ENCOUNTER — Ambulatory Visit (INDEPENDENT_AMBULATORY_CARE_PROVIDER_SITE_OTHER)
Admission: RE | Admit: 2014-12-16 | Discharge: 2014-12-16 | Disposition: A | Discharge status: Home / Self Care | Payer: BLUE CROSS/BLUE SHIELD | Source: Ambulatory Visit | Attending: Internal Medicine | Admitting: Internal Medicine

## 2014-12-16 ENCOUNTER — Ambulatory Visit (INDEPENDENT_AMBULATORY_CARE_PROVIDER_SITE_OTHER): Payer: BLUE CROSS/BLUE SHIELD | Admitting: Internal Medicine

## 2014-12-16 ENCOUNTER — Encounter (INDEPENDENT_AMBULATORY_CARE_PROVIDER_SITE_OTHER): Payer: Self-pay

## 2014-12-16 ENCOUNTER — Telehealth: Payer: Self-pay | Admitting: Emergency Medicine

## 2014-12-16 ENCOUNTER — Encounter: Payer: Self-pay | Admitting: Internal Medicine

## 2014-12-16 VITALS — BP 114/66 | HR 91 | Temp 98.3°F | Ht 70.0 in | Wt 237.0 lb

## 2014-12-16 DIAGNOSIS — R05 Cough: Secondary | ICD-10-CM | POA: Diagnosis not present

## 2014-12-16 DIAGNOSIS — J9611 Chronic respiratory failure with hypoxia: Secondary | ICD-10-CM | POA: Diagnosis not present

## 2014-12-16 DIAGNOSIS — R509 Fever, unspecified: Secondary | ICD-10-CM | POA: Insufficient documentation

## 2014-12-16 DIAGNOSIS — R053 Chronic cough: Secondary | ICD-10-CM

## 2014-12-16 DIAGNOSIS — I10 Essential (primary) hypertension: Secondary | ICD-10-CM

## 2014-12-16 DIAGNOSIS — J849 Interstitial pulmonary disease, unspecified: Secondary | ICD-10-CM | POA: Diagnosis not present

## 2014-12-16 MED ORDER — PANTOPRAZOLE SODIUM 40 MG PO TBEC: 40.0000 mg | DELAYED_RELEASE_TABLET | Freq: Every day | ORAL | Status: AC

## 2014-12-16 MED ORDER — OLMESARTAN MEDOXOMIL 20 MG PO TABS: 20.0000 mg | ORAL_TABLET | Freq: Every day | ORAL | Status: DC

## 2014-12-16 MED ORDER — TRAMADOL HCL 50 MG PO TABS: ORAL_TABLET | ORAL | Status: AC

## 2014-12-16 MED ORDER — FAMOTIDINE 20 MG PO TABS
ORAL_TABLET | ORAL | Status: AC
Start: 2014-12-16 — End: ?

## 2014-12-16 MED ORDER — VALSARTAN 160 MG PO TABS: 160.0000 mg | ORAL_TABLET | Freq: Every day | ORAL | Status: AC

## 2014-12-16 MED ORDER — PREDNISONE 10 MG PO TABS: ORAL_TABLET | ORAL | Status: AC

## 2014-12-16 NOTE — Progress Notes (Addendum)
Subjective:    Patient ID: Fred Fred [name], male    DOB: Mar 29, 1961, 54 y.o.   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 Then the cough became productive after Fred Fred [name] was exposed to a URI at his office, and was accompanied by fever, fatigue, malaise. Fred Fred [name] was dx with PNA and treated with clarithromycin 10/26. Fred Fred [name] improved but kept some SOB a annoying dry cough. A CXR was done 11/4, possible atypical infxn vs ILD?   ROV 06/18/14 -- follow up visit for cough. Returns stating that his cough is not really improved -  Fred Fred [name] underwent PFT that showed principally restriction. 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 Etiology unclear. PFT support restriction.   ROV 07/03/14 -- follow up visit for UIP pattern on CT scan. Fred Fred [name] returns to discuss auto-immune labs > all negative (ANA, RF, ANCA, anti-DNA, Sjogren's, anti-scl70).  Fred Fred [name] is feeling the same-Fred Fred [name] has the same cough and the same exertional dyspnea. Fred Fred [name] is most disturbed by the fact that Fred Fred [name] can't do the same tasks that Fred Fred [name] used to be able to perform without being short of breath. Fred Fred [name] has not had an ACE or CCP level yet.   ROV 11/07/14 -- follow-up visit for interstitial lung disease and a UIP pattern on CT scan of the chest. Fred Fred [name] is undergone bronchoscopy with negative transbronchial biopsies and negative cultures thus far. Started him on empiric prednisone taper. Fred Fred [name] is unsure whether Fred Fred [name] improved on the pred but it was not a steady dose. Fred Fred [name] has labored breathing and cough with exertion.   ROV 12/09/14 -- follow-up visit for interstitial lung disease / UIP. We started empiric prednisone 40 mg daily at his last visit one month ago. Fred Fred [name] had some sleep disturbance, was more active. There were days when Fred Fred [name] felt his breathing was a bit better, but Fred Fred [name] still had coughing spells  that would limit him. Fred Fred [name] coughs up some whitish phlegm.  No significant occupational exposures.  No Fred Fred [name].  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  Fred Fred [name] has cats, used to own a cockatiel but died 10 yrs ago No water damage or mold to his knowledge  Fred Fred [name] has occasional GERD sx No real allergy sx.  Lisinopril x 4 yrs   rec We will perform a high resolution CT scan of your chest to compare with your previous one    12/16/2014 f/u ov/Fred Fred [name] re: cough so hard Fred Fred [name] vomits x months/ much worse since d/c prednisone   Chief Complaint  Patient presents with  . Acute Visit    Pt states having fever, increased SOB, loss of appetite, cough and fatigue- started after Fred Fred [name] stopped taking prednisone 8 days ago.  Fred Fred [name] also c/o severe HA. O2 sats on RA at rest 83%---increased to 94%2lpm.  Fever  started  6/23 last dose pred 12/08/14  Coughs so hard Fred Fred [name] vomits otherwise just white mucus/ same pattern for months.  HA resolved as soon as placed on 02. No sinus complaints, sore throat, rigors, pleuritic cp.  No obvious day to day or daytime variabilty or assoc  chest tightness, subjective wheeze overt sinus or hb symptoms. No unusual exp hx or h/o childhood pna/ asthma or knowledge of premature birth.  Sleeping Fred [name] without nocturnal  or early am exacerbation  of respiratory  c/o's or need for noct saba. Also denies any obvious fluctuation of symptoms with weather or environmental changes or other aggravating or alleviating factors except as outlined above   Current Medications, Allergies, Complete Past Medical History, Past Surgical History, Family History, and Social History were reviewed in Fred Fred [name] electronic medical record.  ROS  The following are not active complaints unless bolded sore throat, dysphagia, dental problems, itching, sneezing,  nasal congestion or excess/ purulent secretions, ear ache,   fever, chills, sweats, unintended wt loss, pleuritic or exertional cp, hemoptysis,   orthopnea pnd or leg swelling, presyncope, palpitations, abdominal pain, anorexia, nausea, vomiting, diarrhea  or change in bowel or urinary habits, change in stools or urine, dysuria,hematuria,  rash, arthralgias, visual complaints, headache, numbness weakness or ataxia or problems with walking or coordination,  change in mood/affect or memory.             Objective:   Physical Exam   ENT: No lesions,  mouth clear,  oropharynx clear, no postnasal drip  Neck: No JVD, no TMG, no carotid bruits  Lungs: No use of accessory muscles, B insp crackles at bases with decreased bs/ minimal bv changes / cough on very  deep insp  Cardiovascular: RRR, heart sounds normal, no murmur or gallops, no peripheral edema  Musculoskeletal: No deformities, no cyanosis  Pos mild/moderate  Clubbing both hands  Neuro: alert, non focal  Skin: Warm, no lesions or rashes   CXR PA and Lateral:   12/16/2014 :     I personally reviewed images and agree with radiology impression as follows:   Chronically increased pulmonary interstitial markings. One cannot exclude superimposed acute bronchitic change.      Assessment & Plan:

## 2014-12-16 NOTE — Assessment & Plan Note (Signed)
I suspect based on the HA that immediately resolved on 02 that this is not acute but chronic /progressive hypoxemia and rec for now 02 2lpm and do walkng titration next ov if feeling up to it

## 2014-12-16 NOTE — Patient Instructions (Addendum)
levaquin 750 mg one daily x 5 days and drink plenty of fluids   Prednisone 10 mg take  4 each am x 2 days,   2 each am x 2 days,  1 each am x 2 days and stop   benicar 20 one half daily only if bp above 140/90  Pantoprazole (protonix) 40 mg   Take  30-60 min before first meal of the day and Pepcid (famotidine)  20 mg one @  bedtime until return to office - this is the best way to tell whether stomach acid is contributing to your problem.    GERD (REFLUX)  is an extremely common cause of respiratory symptoms just like yours , many times with no obvious heartburn at all.    It can be treated with medication, but also with lifestyle changes including elevation of the head of your bed (ideally with 6 inch  bed blocks),  Smoking cessation, avoidance of late meals, excessive alcohol, and avoid fatty foods, chocolate, peppermint, colas, red wine, and acidic juices such as orange juice.  NO MINT OR MENTHOL PRODUCTS SO NO COUGH DROPS  USE SUGARLESS CANDY INSTEAD (Jolley ranchers or Stover's or Life Savers) or even ice chips will also do - the key is to swallow to prevent all throat clearing. NO OIL BASED VITAMINS - use powdered substitutes.   Please see patient coordinator before you leave today  to schedule 2lpm 02 24/7 for now   For cough >>> Take delsym two tsp every 12 hours and supplement if needed with  tramadol 50 mg up to 2 every 4 hours to suppress the urge to cough. Swallowing water or using ice chips/non mint and menthol containing candies (such as lifesavers or sugarless jolly ranchers) are also effective.  You should rest your voice and avoid activities that you know make you cough.  Once you have eliminated the cough for 3 straight days try reducing the tramadol first,  then the delsym as tolerated.    If condition worsens go to ER - if better see Byrum or me in 2 weeks

## 2014-12-16 NOTE — Assessment & Plan Note (Signed)
DDx for pulmonary fibrosis  includes idiopathic pulmonary fibrosis, pulmonary fibrosis associated with rheumatologic diseases (which have a relatively benign course in most cases) , adverse effect from  drugs such as chemotherapy or amiodarone exposure, nonspecific interstitial pneumonia which is typically steroid responsive, and chronic hypersensitivity pneumonitis.   In active  smokers Langerhan's Cell  Histiocyctosis (eosinophilic granuomatosis),  DIP,  and Respiratory Bronchiolitis ILD also need to be considered,    Use of PPI is associated with improved survival time and with decreased radiologic fibrosis per King's study published in AJRCCM vol 184 p1390.  Dec 2011  This may not be cause and effect, but given that he is coughing so hard he vomits and h/o over GERD and  how universally unimpressive and expensive  all the other  Drugs developed to day  have been for pf,   rec start  rx ppi / diet/ lifestyle modification and f/u with Byrum to consider OFEV vs Esbriet but strongly suspect he is heading toward lung transplant sooner rather than later and may want [facility] for that reason

## 2014-12-16 NOTE — Assessment & Plan Note (Signed)
No clear cause but could easily hide CAP or Asp pna on cxr > rx x 5 days with levaquin 750 x 5 day and regroup  Pt offered admit but firmly declined and instructed in writing to go to ER at any point should he decline on present rx.

## 2014-12-16 NOTE — Assessment & Plan Note (Signed)
ACE inhibitors are problematic in  pts with airway complaints because  even experienced pulmonologists can't always distinguish ace effects from copd/asthma/pnds/ allergies etc.  By themselves they don't actually cause a problem, much like oxygen can't by itself start a fire, but they certainly serve as a powerful catalyst or enhancer for any "fire"  or inflammatory process in the upper airway, be it caused by an ET  tube or more commonly reflux (especially in the obese or pts with known GERD or who are on biphoshonates) or URI's, due to interference with bradykinin clearance.  The effects of acei on bradykinin levels occurs in 100% of pt's on acei (unless they surreptitiously stop the med!) but the classic cough is only reported in 5%.  This leaves 95% of pts on acei's  with a variety of syndromes including no identifiable symptom in most  vs non-specific symptoms that wax and wane depending on what other insult is occuring at the level of the upper airway, like GERD here.  rec trial of valsartan 160 mg daily

## 2014-12-16 NOTE — Telephone Encounter (Signed)
Spoke with pt's wife.  She states at ov on 12/09/14 pt was instructed to stop Prednisone after being on 40 mg daily for the past month.  2 days later pt started with HA's, leg weakness, increased cough (clear), decreased appetite, fever up to 103, rib soreness with cough, sob.  PT given appt with Dr wert today.

## 2014-12-16 NOTE — Assessment & Plan Note (Signed)
The most common causes of chronic cough in immunocompetent adults include the following: upper airway cough syndrome (UACS), previously referred to as postnasal drip syndrome (PNDS), which is caused by variety of rhinosinus conditions; (2) asthma; (3) GERD; (4) chronic bronchitis from cigarette smoking or other inhaled environmental irritants; (5) nonasthmatic eosinophilic bronchitis; and (6) bronchiectasis.   These conditions, singly or in combination, have accounted for up to 94% of the causes of chronic cough in prospective studies.   Other conditions have constituted no >6% of the causes in prospective studies These have included bronchogenic carcinoma, chronic interstitial pneumonia, sarcoidosis, left ventricular failure, ACEI-induced cough, and aspiration from a condition associated with pharyngeal dysfunction.    Chronic cough is often simultaneously caused by more than one condition. A single cause has been found from 38 to 82% of the time, multiple causes from 18 to 62%. Multiply caused cough has been the result of three diseases up to 42% of the time.   I had an extended discussion with the patient reviewing all relevant studies completed to date and  lasting 25 minutes of a 40 minute visit    1) the cough on deep insp is clearly related to UIP > rx with short course steroids/tramadol  2) the cough to point of vomit is clearly GERD which is combination with ACEi is like 02 and gasoline, all it takes is a match (like URI) to really set off violent incessant cyclical cough so rec max gerd and cough suppression and leave off acei indefinitely   Each maintenance medication was reviewed in detail including most importantly the difference between maintenance and prns and under what circumstances the prns are to be triggered using an action plan format that is not reflected in the computer generated alphabetically organized AVS.    Please see instructions for details which were reviewed in writing  and the patient given a copy highlighting the part that I personally wrote and discussed at today's ov.

## 2014-12-17 ENCOUNTER — Telehealth: Payer: Self-pay | Admitting: Internal Medicine

## 2014-12-17 ENCOUNTER — Other Ambulatory Visit: Payer: Self-pay | Admitting: Internal Medicine

## 2014-12-17 MED ORDER — LEVOFLOXACIN 750 MG PO TABS: 750.0000 mg | ORAL_TABLET | Freq: Every day | ORAL | Status: AC

## 2014-12-17 NOTE — Telephone Encounter (Signed)
Spoke with the pt's spouse  She states pt is not improving since ov 12/16/14 Started pred today and will start levaquin also this pm  He is still having a lot of DOE, fever and heart rate is increased even at rest  His BP has been low and so I have advised he not take his BP pill  He has not taken it today  I advised if he is comfortable at rest on o2 to just continue with ov recs, but if not go to ER  She is concerned about his heart rate being increased and wants to speak with MW about this  Please advise thanks

## 2014-12-17 NOTE — Telephone Encounter (Signed)
Discussed with wife - has not started abx (my fault for not clicking on it correctly ) nor his ARB as appetite poor, bp still low p missed only one dose so far of ACEi  rec start levquin asap As long as comfortable at rest on 02 and taking fluids well no need to go to ER but if worse at all > er

## 2014-12-19 ENCOUNTER — Other Ambulatory Visit: Payer: Self-pay

## 2014-12-24 ENCOUNTER — Other Ambulatory Visit: Payer: Self-pay | Admitting: Family Medicine

## 2014-12-24 NOTE — Telephone Encounter (Signed)
RF request for fenofibrate.  LOV: 04/22/14 Next ov: None Last written: 04/14/14 #30 w/ 6RF

## 2014-12-25 ENCOUNTER — Other Ambulatory Visit: Payer: Self-pay | Admitting: *Deleted

## 2014-12-25 MED ORDER — LOVASTATIN 20 MG PO TABS: 20.0000 mg | ORAL_TABLET | Freq: Every day | ORAL | Status: AC

## 2014-12-25 NOTE — Telephone Encounter (Signed)
RF request for LOV: Next ov:  Last written: 

## 2014-12-26 NOTE — Telephone Encounter (Signed)
I am going to fill his fenofibrate for 3 mo but pls tell him to make an appt for a complete physical exam with me sometime in the next 3 months.  He is overdue for routine lab monitoring.-thx

## 2014-12-27 ENCOUNTER — Emergency Department (HOSPITAL_COMMUNITY)
Admission: EM | Admit: 2014-12-27 | Discharge: 2015-01-19 | Disposition: E | Payer: BLUE CROSS/BLUE SHIELD | Attending: Emergency Medicine | Admitting: Emergency Medicine

## 2014-12-27 ENCOUNTER — Encounter (HOSPITAL_COMMUNITY): Payer: Self-pay | Admitting: Emergency Medicine

## 2014-12-27 DIAGNOSIS — K219 Gastro-esophageal reflux disease without esophagitis: Secondary | ICD-10-CM | POA: Diagnosis not present

## 2014-12-27 DIAGNOSIS — I10 Essential (primary) hypertension: Secondary | ICD-10-CM | POA: Insufficient documentation

## 2014-12-27 DIAGNOSIS — Z79899 Other long term (current) drug therapy: Secondary | ICD-10-CM | POA: Insufficient documentation

## 2014-12-27 DIAGNOSIS — I469 Cardiac arrest, cause unspecified: Secondary | ICD-10-CM | POA: Diagnosis not present

## 2014-12-27 DIAGNOSIS — Z8709 Personal history of other diseases of the respiratory system: Secondary | ICD-10-CM | POA: Insufficient documentation

## 2014-12-27 DIAGNOSIS — E785 Hyperlipidemia, unspecified: Secondary | ICD-10-CM | POA: Insufficient documentation

## 2014-12-30 ENCOUNTER — Ambulatory Visit: Payer: BLUE CROSS/BLUE SHIELD | Admitting: Internal Medicine

## 2015-01-06 ENCOUNTER — Other Ambulatory Visit: Payer: Self-pay

## 2015-01-15 ENCOUNTER — Ambulatory Visit: Payer: Self-pay | Admitting: Emergency Medicine

## 2015-01-16 ENCOUNTER — Telehealth: Payer: Self-pay | Admitting: Emergency Medicine

## 2015-01-16 NOTE — Telephone Encounter (Signed)
I spoke with Mrs Ottaway today regarding the death of her husband. He apparently had a precipitous decline in his resp status after he was seen by me 6/21. I suspect that this ultimately led to resp arrest and then cardiac arrest. Remains unclear to me what was behind his ILD. We were discussing surgical bx, possible pirfenidone or OFEV, but these had not been done. He was on prednisone before he declined, ? A possible infectious process. No post-mortem exam was done. I tried to reassure her that she did everything that she could for him, as did we.   Levy Pupa, MD, PhD 01/16/2015, 1:23 PM [facility] [phone] or if no answer [phone]

## 2015-01-19 NOTE — ED Provider Notes (Signed)
CSN: [id number]     Arrival date & time 01-26-15  0840 History   First MD Initiated Contact with Patient 01-26-15 0848     Chief Complaint  Patient presents with  . Cardiac Arrest     (Consider location/radiation/quality/duration/timing/severity/associated sxs/prior Treatment) HPI  54 year old male presents by EMS in cardiac arrest. EMS was initially called out because of shortness of breath, shortly after fire arrival the patient decompensated and coded. Patient has never been in any rhythm besides PEA and asystole. Patient has a history of interstitial lung disease and was recently discharged from the hospital 3 months ago. The patient has been receiving CPR for at least one hour and 20 minutes and has received 17 epinephrines. EMS intubated the patient and gave an albuterol neb which transiently raise the patient's end-tidal CO2 but the patient never recovered pulses. No family is currently available and further history is limited by the situation.  Past Medical History  Diagnosis Date  . GERD (gastroesophageal reflux disease)     controlled well with diet and prn H2 blocker  . Hyperlipidemia approx 2008  . Hypertension approx 2008  . Interstitial lung disease 2015    Per CXR 04/2014--referred to pulm.  CT confirmed same, as did PFTs (restrictive pattern), autoimmune labs neg.  Bronchoscopy/BAL/biopsies done 10/10/14.  Results neg.  Pt still needs ACE level and CCP.   Past Surgical History  Procedure Laterality Date  . Wisdom tooth extraction  approx 2008    No bleeding or anesthesia issues.  . Flexible bronchoscopy w/ bronchopulmonary lavage  10/10/14    Dr. Delton Coombes  . Video bronchoscopy Bilateral 10/10/2014    Procedure: VIDEO BRONCHOSCOPY WITH FLUORO;  Surgeon: Leslye Peer, MD;  Location: [facility];  Service: Cardiopulmonary;  Laterality: Bilateral;   Family History  Problem Relation Age of Onset  . Cancer Mother     colon and breast  . Arthritis Mother   . Diabetes Mother    . Heart disease Mother   . Hyperlipidemia Mother   . Kidney disease Mother   . Heart disease Father     "died in his sleep", family suspected heart dz   History  Substance Use Topics  . Smoking status: Never Smoker   . Smokeless tobacco: Never Used  . Alcohol Use: 0.0 oz/week    0 Standard drinks or equivalent per week    Review of Systems  Unable to perform ROS: Patient unresponsive      Allergies  Review of patient's allergies indicates no known allergies.  Home Medications   Prior to Admission medications   Medication Sig Start Date End Date Taking? Authorizing Provider  acetaminophen (TYLENOL) 500 MG tablet Take 500 mg by mouth every 6 (six) hours as needed.    Historical Provider, MD  Aspirin-Acetaminophen-Caffeine (EXCEDRIN MIGRAINE PO) as directed.    Historical Provider, MD  dextromethorphan-guaiFENesin (MUCINEX DM) 30-600 MG per 12 hr tablet Take 1 tablet by mouth 2 (two) times daily as needed for cough.    Historical Provider, MD  famotidine (PEPCID) 20 MG tablet One at bedtime 12/16/14   Nyoka Cowden, MD  fenofibrate 160 MG tablet TAKE ONE TABLET BY MOUTH ONCE DAILY 12/26/14   Jeoffrey Massed, MD  levofloxacin (LEVAQUIN) 750 MG tablet Take 1 tablet (750 mg total) by mouth daily. 12/17/14   Nyoka Cowden, MD  lovastatin (MEVACOR) 20 MG tablet Take 1 tablet (20 mg total) by mouth at bedtime. 12/25/14   Jeoffrey Massed, MD  pantoprazole (  PROTONIX) 40 MG tablet Take 1 tablet (40 mg total) by mouth daily. Take 30-60 min before first meal of the day 12/16/14   Nyoka CowdenMichael B Wert, MD  predniSONE (DELTASONE) 10 MG tablet Take  4 each am x 2 days,   2 each am x 2 days,  1 each am x 2 days and stop 12/16/14   Nyoka CowdenMichael B Wert, MD  traMADol Janean Sark(ULTRAM) 50 MG tablet 1-2 every 4 hours as needed for cough or pain 12/16/14   Nyoka CowdenMichael B Wert, MD  valsartan (DIOVAN) 160 MG tablet Take 1 tablet (160 mg total) by mouth daily. 12/16/14   Nyoka CowdenMichael B Wert, MD   There were no vitals taken for this  visit. Physical Exam  Constitutional: He appears well-developed and well-nourished. He is intubated.  HENT:  Head: Normocephalic and atraumatic.  Right Ear: External ear normal.  Left Ear: External ear normal.  Nose: Nose normal.  Eyes: Right eye exhibits no discharge. Left eye exhibits no discharge.  Pupils fixed and dilated bilaterally  Neck: Neck supple.  Cardiovascular:  Pulses:      Carotid pulses are 0 on the right side, and 0 on the left side.      Femoral pulses are 0 on the right side, and 0 on the left side. Pulmonary/Chest: He is intubated.  Diffuse decreased breath sounds  Abdominal: Soft. He exhibits no distension.  Musculoskeletal: He exhibits no edema.  Neurological: He is unresponsive. GCS eye subscore is 1. GCS verbal subscore is 1. GCS motor subscore is 1.  Skin: Skin is warm and dry.  Nursing note and vitals reviewed.   ED Course  Procedures (including critical care time) Labs Review Labs Reviewed - No data to display  Imaging Review No results found.   EKG Interpretation None      Cardiopulmonary Resuscitation (CPR) Procedure Note Directed/Performed by: Pricilla LovelessGOLDSTON, Shynia Daleo T I personally directed ancillary staff and/or performed CPR in an effort to regain return of spontaneous circulation and to maintain cardiac, neuro and systemic perfusion.   MDM   Final diagnoses:  Cardiac arrest    Patient presents in cardiac arrest. CPR was continued up on arrival however the patient was in asystole during the ED stay. Bedside ultrasound by myself is difficult due to body habitus but no obvious pericardial effusion and no cardiac activity observed. Given the prolonged downtime as well as very large amount of epinephrine with no return of spontaneous circulation I feel there is no chance for recovery and the patient was pronounced in the ER. Discussed with ME, given hx of lung disease despite his age he is not a candidate for ME case. Discussed with wife and answered  questions as able. Family notes patient has had sats in the 4770s and 80s at home but did not come in because he didn't want to be admitted. Could all be from hypoxia. Called PCP's partner, Dr. Rogelia RohrerBlythe, who is ok with sending death certificate to their office.     Pricilla LovelessScott Jade Burright, MD 09-05-2014 1739

## 2015-01-19 NOTE — Progress Notes (Signed)
Chaplain was page as a CPR in route. Pt arrive DOA. Spouse was in route and did not know. Chaplain visited with family before the Dr arrived and set with the spouse until Dr arrived. Spouse was visibly shaken. She stated she didn't know what to do. Chaplain sit with spouse and neighbor  Until spouse was able to gather. Neighbor was a large support as he asked the questions that were needed to move the process along. Spouse had to wait for Pt's family to arrive from out of town to make a few decisions. Chaplain checked on the spouse and neighbor throughout the time.   Oct 19, 2014 1100  Clinical Encounter Type  Visited With Family  Visit Type Spiritual support  Referral From Care management  Spiritual Encounters  Spiritual Needs Emotional;Prayer  Stress Factors  Family Stress Factors Loss

## 2015-01-19 NOTE — ED Notes (Signed)
Per EMS: pt from home with witnessed arrest by fire initially c/o SOB; pt placed on home O2 recently for "lung scarring"; pt given 17 epi in route 1 D50 and  narcan; pt in PEA; L I/O, R 16g IJ

## 2015-01-19 DEATH — deceased

## 2016-07-08 IMAGING — CT CT CHEST HIGH RESOLUTION W/O CM
2 of 6 series · 8 of 36 positions shown, 10 images · non-contrast
Comparison: No priors.

CLINICAL DATA: 53-year-old male with progressively worsening
shortness of breath and persistent cough for several months.
Evaluate for pneumonia or interstitial lung disease.

EXAM:
CT CHEST WITHOUT CONTRAST
TECHNIQUE: Multidetector CT imaging of the chest was performed following the
standard protocol without intravenous contrast. High resolution
imaging of the lungs, as well as inspiratory and expiratory imaging,
was performed.

[Series 8: hires retro entire lungs · axial · 0.71mm/px · z∈[-250,-80]mm · 5 of 27 slices shown, 7 images]
[im 5/27  mediastinal]
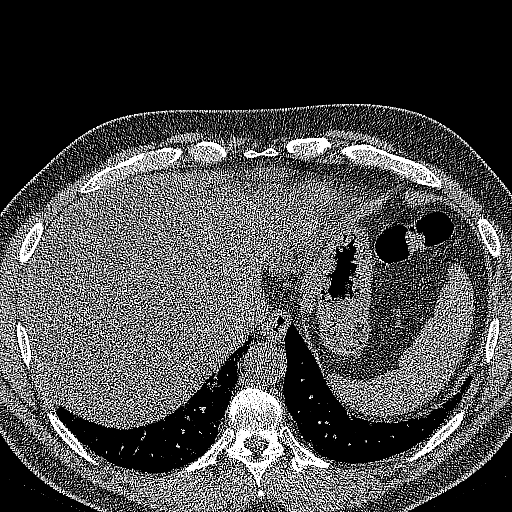
[im 5/27  lung]
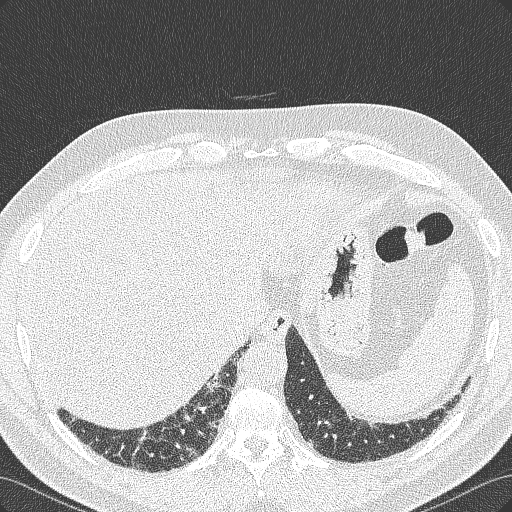
[im 9/27  lung]
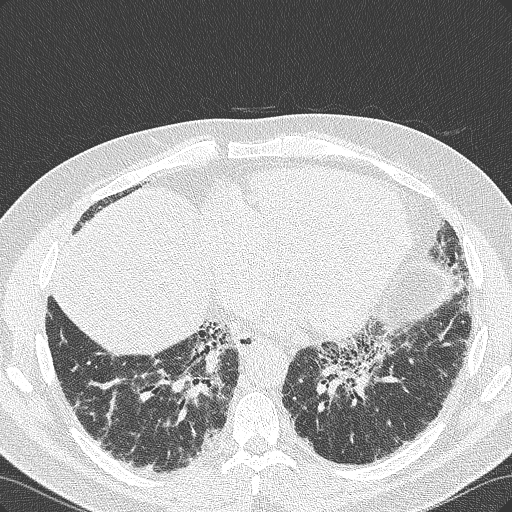
[im 14/27  lung]
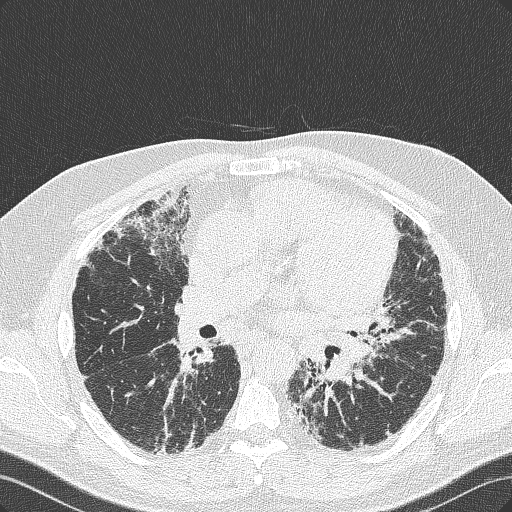
[im 18/27  lung]
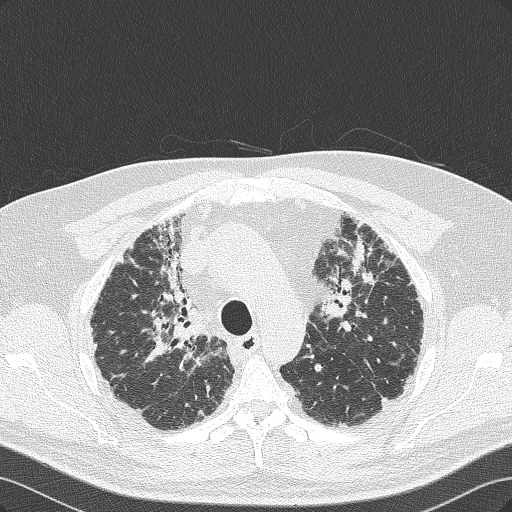
[im 22/27  mediastinal]
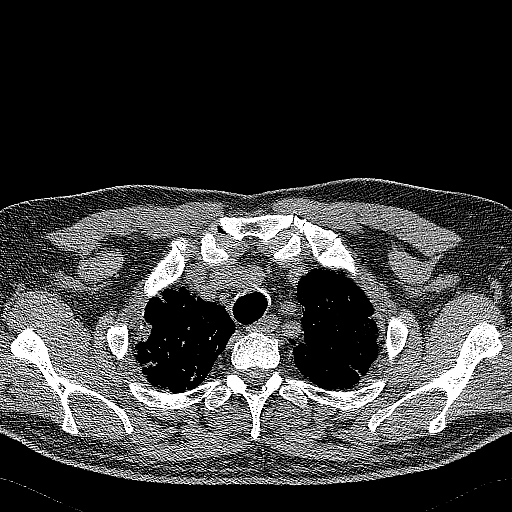
[im 22/27  lung]
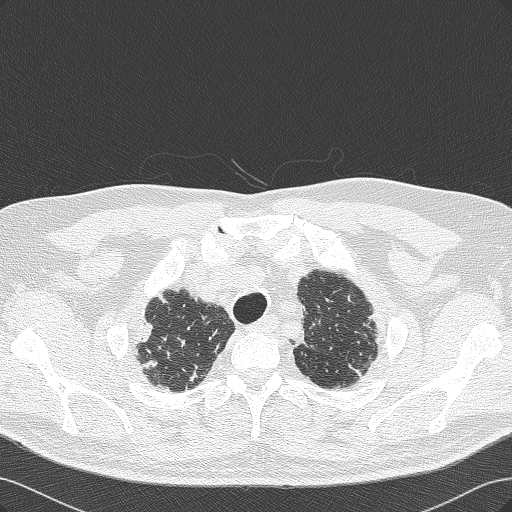

[Series 602: <mpr thick range> · coronal · 0.71mm/px · 3 of 133 slices shown]
[im 27/133  lung]
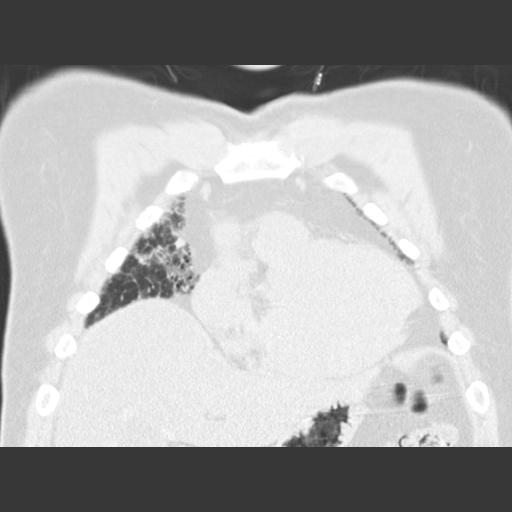
[im 53/133  lung]
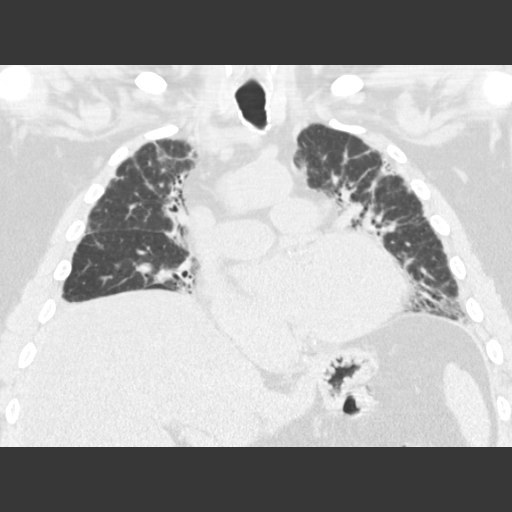
[im 80/133  lung]
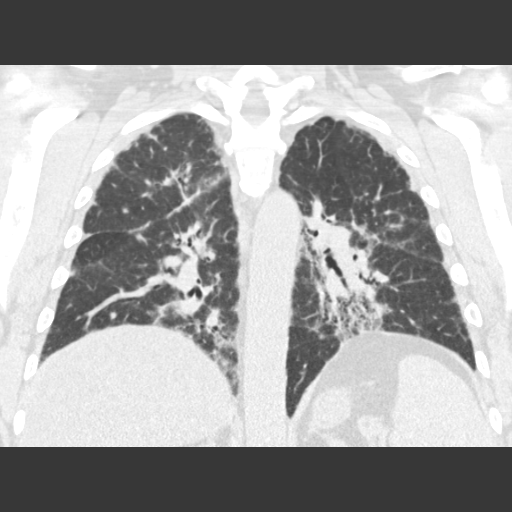

[8 of 36 positions shown; findings below may reference images not displayed]

FINDINGS: Mediastinum: Heart size is normal. There is no significant
pericardial fluid, thickening or pericardial calcification. There is
atherosclerosis of the thoracic aorta, the great vessels of the
mediastinum and the coronary arteries, including calcified
atherosclerotic plaque in the left main, left anterior descending
and right coronary arteries. Multiple borderline enlarged and mildly
enlarged mediastinal lymph nodes are noted, largest of which measure
14 mm in short axis the prevascular nodal station, and 12 mm in
short axis in the low right paratracheal station. Esophagus is
unremarkable in appearance.

Lungs/Pleura: High-resolution images demonstrate extensive
subpleural and peribronchovascular reticulation throughout the lungs
bilaterally. These findings are rather diffuse throughout the lung
parenchyma, with no clear cut craniocaudal gradient. Severe
thickening of the peribronchovascular interstitium is also noted.
Multifocal parenchymal banding. Areas of cylindrical and varicose
bronchiectasis are noted throughout the regions of most severe
disease, most evident in the medial segment of the right middle lobe
and inferior segment of the lingula. Scattered areas of honeycombing
are also noted, most evident in the medial aspect of the right lower
lobe, central aspect of the left lower lobe, and medial segment of
the right middle lobe. Inspiratory and expiratory imaging is
unremarkable. No confluent consolidative airspace disease. No
pleural effusions.

Upper Abdomen: Diffuse decreased attenuation throughout the hepatic
parenchyma, compatible with hepatic steatosis. Spleen is
incompletely visualized, but appears enlarged measuring up to 15.6 x
5.5 cm on axial images.

Musculoskeletal: There are no aggressive appearing lytic or blastic
lesions noted in the visualized portions of the skeleton.
IMPRESSION: 1. The appearance of the lungs is compatible with underlying
interstitial lung disease. The spectrum of findings discussed above
is most strongly suggestive of usual interstitial pneumonia (UIP),
particularly in light of areas of overt honeycombing.
2. Mediastinal adenopathy is presumably chronic and reactive in the
setting of underlying interstitial lung disease.
3. No evidence of active pneumonia on today's examination.
4. Hepatic steatosis.
5. Atherosclerosis, including left main and 2 vessel coronary artery
disease. Please note that although the presence of coronary artery
calcium documents the presence of coronary artery disease, the
severity of this disease and any potential stenosis cannot be
assessed on this non-gated CT examination. Assessment for potential
risk factor modification, dietary therapy or pharmacologic therapy
may be warranted, if clinically indicated.
6. Although incompletely visualized, the spleen appears enlarged
measuring at least 15.6 x 5.5 cm.
